# Patient Record
Sex: Male | Born: 1990 | Race: Black or African American | Hispanic: No | Marital: Single | Smoking: Never smoker
Health system: Southern US, Community
[De-identification: ages and names within clinical notes are randomized; demographics above are authoritative.]

## PROBLEM LIST (undated history)

## (undated) DIAGNOSIS — E119 Type 2 diabetes mellitus without complications: Secondary | ICD-10-CM

## (undated) DIAGNOSIS — I10 Essential (primary) hypertension: Secondary | ICD-10-CM

## (undated) DIAGNOSIS — G4730 Sleep apnea, unspecified: Secondary | ICD-10-CM

## (undated) DIAGNOSIS — M659 Synovitis and tenosynovitis, unspecified: Secondary | ICD-10-CM

## (undated) HISTORY — PX: OTHER SURGICAL HISTORY: SHX169

## (undated) HISTORY — DX: Synovitis and tenosynovitis, unspecified: M65.9

## (undated) HISTORY — DX: Sleep apnea, unspecified: G47.30

---

## 2009-12-16 ENCOUNTER — Ambulatory Visit: Payer: Self-pay | Admitting: Otolaryngology

## 2009-12-28 ENCOUNTER — Ambulatory Visit: Payer: Self-pay | Admitting: Otolaryngology

## 2010-04-08 ENCOUNTER — Emergency Department: Payer: Self-pay | Admitting: Emergency Medicine

## 2012-10-10 ENCOUNTER — Ambulatory Visit: Payer: Self-pay | Admitting: Internal Medicine

## 2013-10-22 ENCOUNTER — Ambulatory Visit: Payer: Self-pay | Admitting: Internal Medicine

## 2013-10-22 ENCOUNTER — Emergency Department: Payer: Self-pay | Admitting: Emergency Medicine

## 2013-10-22 LAB — URINALYSIS, COMPLETE
BILIRUBIN, UR: NEGATIVE
Bacteria: NONE SEEN
Hyaline Cast: 75
LEUKOCYTE ESTERASE: NEGATIVE
NITRITE: NEGATIVE
PH: 5 (ref 4.5–8.0)
Protein: 100
SPECIFIC GRAVITY: 1.031 (ref 1.003–1.030)
Squamous Epithelial: <1
WBC UR: 1 /HPF (ref 0–5)

## 2013-10-22 LAB — COMPREHENSIVE METABOLIC PANEL
ALBUMIN: 3.9 g/dL (ref 3.4–5.0)
ANION GAP: 12 (ref 7–16)
Alkaline Phosphatase: 143 U/L — ABNORMAL HIGH
BUN: 13 mg/dL (ref 7–18)
Bilirubin,Total: 0.8 mg/dL (ref 0.2–1.0)
Calcium, Total: 10 mg/dL (ref 8.5–10.1)
Chloride: 93 mmol/L — ABNORMAL LOW (ref 98–107)
Co2: 24 mmol/L (ref 21–32)
Creatinine: 1.45 mg/dL — ABNORMAL HIGH (ref 0.60–1.30)
EGFR (African American): >60
EGFR (Non-African Amer.): >60
GLUCOSE: 602 mg/dL — AB (ref 65–99)
Osmolality: 287 (ref 275–301)
Potassium: 4.2 mmol/L (ref 3.5–5.1)
SGOT(AST): 109 U/L — ABNORMAL HIGH (ref 15–37)
SGPT (ALT): 117 U/L — ABNORMAL HIGH (ref 12–78)
Sodium: 129 mmol/L — ABNORMAL LOW (ref 136–145)
TOTAL PROTEIN: 8.9 g/dL — AB (ref 6.4–8.2)

## 2013-10-22 LAB — CBC
HCT: 48.5 % (ref 40.0–52.0)
HGB: 16.1 g/dL (ref 13.0–18.0)
MCH: 27.2 pg (ref 26.0–34.0)
MCHC: 33.1 g/dL (ref 32.0–36.0)
MCV: 82 fL (ref 80–100)
Platelet: 280 10*3/uL (ref 150–440)
RBC: 5.91 10*6/uL — ABNORMAL HIGH (ref 4.40–5.90)
RDW: 14.5 % (ref 11.5–14.5)
WBC: 10.1 10*3/uL (ref 3.8–10.6)

## 2013-10-22 LAB — HEMOGLOBIN A1C: HEMOGLOBIN A1C: 14 % — AB (ref 4.2–6.3)

## 2015-08-23 ENCOUNTER — Other Ambulatory Visit: Payer: Self-pay | Admitting: Internal Medicine

## 2015-08-23 DIAGNOSIS — K7290 Hepatic failure, unspecified without coma: Secondary | ICD-10-CM

## 2015-08-29 ENCOUNTER — Ambulatory Visit: Payer: Managed Care, Other (non HMO)

## 2017-08-12 ENCOUNTER — Ambulatory Visit (INDEPENDENT_AMBULATORY_CARE_PROVIDER_SITE_OTHER): Payer: Managed Care, Other (non HMO)

## 2017-08-12 ENCOUNTER — Encounter: Payer: Self-pay | Admitting: Emergency Medicine

## 2017-08-12 ENCOUNTER — Ambulatory Visit
Admission: EM | Admit: 2017-08-12 | Discharge: 2017-08-12 | Disposition: A | Discharge status: Home / Self Care | Payer: Managed Care, Other (non HMO) | Attending: Family Medicine | Admitting: Family Medicine

## 2017-08-12 ENCOUNTER — Ambulatory Visit: Payer: Managed Care, Other (non HMO)

## 2017-08-12 DIAGNOSIS — R2242 Localized swelling, mass and lump, left lower limb: Secondary | ICD-10-CM | POA: Diagnosis not present

## 2017-08-12 DIAGNOSIS — M25572 Pain in left ankle and joints of left foot: Secondary | ICD-10-CM

## 2017-08-12 HISTORY — DX: Essential (primary) hypertension: I10

## 2017-08-12 HISTORY — DX: Type 2 diabetes mellitus without complications: E11.9

## 2017-08-12 MED ORDER — MELOXICAM 15 MG PO TABS: 15.0000 mg | ORAL_TABLET | Freq: Every day | ORAL | 0 refills | Status: DC

## 2017-08-12 NOTE — ED Triage Notes (Signed)
Patient in today with his mother c/o left ankle pain and swelling x 3 days. Patient has had an old injury with hairline fracture in the past ~4-5 years ago. No injury noted for this episode of pain and swelling.

## 2017-08-12 NOTE — Discharge Instructions (Signed)
Medication as needed.  Try the exercises.   Take care  Dr. Adriana Simasook

## 2017-08-12 NOTE — ED Provider Notes (Signed)
[facility]    CSN: [id number] Arrival date & time: 08/12/17  1037     History   Chief Complaint Chief Complaint  Patient presents with  . Ankle Pain    swelling   HPI  26 year old male presents with the above complaint.  Started on Friday.  He reports left ankle/foot pain and associated swelling.  He is not sure of any injury.  He is on his feet a lot.  No medications or interventions tried.  No known exacerbating or relieving factors.  Mild to moderate in severity.  His primary concern is swelling.  He reports a prior old injury.  No new medications.  No other associated symptoms.  No other complaints at this time.  Past Medical History:  Diagnosis Date  . Diabetes mellitus without complication (HCC)   . Hypertension   Proteinuria Morbid obesity  History reviewed. No pertinent surgical history.   Home Medications    Prior to Admission medications   Medication Sig Start Date End Date Taking? Authorizing Provider  hydrochlorothiazide (HYDRODIURIL) 25 MG tablet Take 25 mg by mouth daily.   Yes [provider]  lisinopril (PRINIVIL,ZESTRIL) 20 MG tablet Take 20 mg by mouth daily.   Yes [provider]  omeprazole (PRILOSEC) 20 MG capsule Take 20 mg by mouth daily as needed.   Yes [provider]  sitaGLIPtin-metformin (JANUMET) 50-1000 MG tablet Take 1 tablet by mouth 2 (two) times daily with a meal.   Yes [provider]  meloxicam (MOBIC) 15 MG tablet Take 1 tablet (15 mg total) by mouth daily. 08/12/17   Tommie Samsook, Larrie Fraizer G, DO   Family History Kidney failure Maternal Aunt  on dialysis - cause unknown  No Known Problems Mother     Social History Social History  Substance Use Topics  . Smoking status: Never Smoker  . Smokeless tobacco: Never Used  . Alcohol use No     Allergies   Patient has no known allergies.   Review of Systems Review of Systems  Musculoskeletal:       Left ankle and left heel pain. Left  ankle swelling.  All other systems reviewed and are negative.  Physical Exam Triage Vital Signs ED Triage Vitals  Enc Vitals Group     BP 08/12/17 1053 121/74     Pulse Rate 08/12/17 1053 100     Resp 08/12/17 1053 16     Temp 08/12/17 1053 98.7 F (37.1 C)     Temp Source 08/12/17 1053 Oral     SpO2 08/12/17 1053 100 %     Weight 08/12/17 1052 (!) 372 lb (168.7 kg)     Height 08/12/17 1052 5\' 7"  (1.702 m)     Head Circumference --      Peak Flow --      Pain Score 08/12/17 1052 6     Pain Loc --      Pain Edu? --      Excl. in GC? --    Updated Vital Signs BP 121/74 (BP Location: Left Arm)   Pulse 100   Temp 98.7 F (37.1 C) (Oral)   Resp 16   Ht 5\' 7"  (1.702 m)   Wt (!) 372 lb (168.7 kg)   SpO2 100%   BMI 58.26 kg/m   Physical Exam  Constitutional: He is oriented to person, place, and time. He appears well-developed. No distress.  Morbidly obese.  HENT:  Head: Normocephalic and atraumatic.  Nose: Nose normal.  Eyes:  Conjunctivae are normal. No scleral icterus.  Neck: Normal range of motion. No tracheal deviation present.  Cardiovascular: Normal rate and regular rhythm.   No murmur heard. Pulmonary/Chest: Effort normal and breath sounds normal. He has no wheezes. He has no rales.  Musculoskeletal:  Left ankle -no pain with range of motion.  Normal range of motion, although this appears to be limited by morbid obesity.  No discrete areas of tenderness appreciated.  Swelling is noted particularly at the lateral malleolus.  Patient does have some vague heel pain but it is hard to localize. Mild warmth.  Neurological: He is alert and oriented to person, place, and time.  No apparent focal deficits.  Normal tone.  Skin: Skin is warm. No rash noted.  Psychiatric: He has a normal mood and affect. His behavior is normal. Thought content normal.  Vitals reviewed.  UC Treatments / Results  Labs (all labs ordered are listed, but only abnormal results are displayed) Labs  Reviewed - No data to display  EKG  EKG Interpretation None       Radiology Dg Ankle Complete Left  Result Date: 08/12/2017 CLINICAL DATA:  Left ankle pain and swelling. EXAM: LEFT ANKLE COMPLETE - 3+ VIEW COMPARISON:  None. FINDINGS: There is no evidence of fracture, dislocation, or joint effusion. Mild soft tissue prominence overlying the lateral malleolus. Mild degenerative disease of the ankle mortise affecting primarily the medial ankle. No bony lesions. IMPRESSION: Mild soft tissue prominence without evidence of acute injury. Mild degenerative disease of the ankle mortise primarily affecting the medial ankle. Electronically Signed   By: Irish Lack M.D.   On: 08/12/2017 12:01   Dg Foot Complete Left  Result Date: 08/12/2017 CLINICAL DATA:  Swelling of left heel with pain. EXAM: LEFT FOOT - COMPLETE 3+ VIEW COMPARISON:  None. FINDINGS: There is no evidence of fracture or dislocation. There is no evidence of arthropathy or other focal bone abnormality. Mild soft tissue prominence along the plantar aspect of the foot. No soft tissue foreign body identified. IMPRESSION: Mild plantar soft tissue prominence. Electronically Signed   By: Irish Lack M.D.   On: 08/12/2017 12:03    Procedures Procedures (including critical care time)  Medications Ordered in UC Medications - No data to display   Initial Impression / Assessment and Plan / UC Course  I have reviewed the triage vital signs and the nursing notes.  Pertinent labs & imaging results that were available during my care of the patient were reviewed by me and considered in my medical decision making (see chart for details).    26 year old male presents with ankle/foot pain.  X-ray with mild degenerative changes but no acute findings.  Uncertain etiology.  He may have a component of plantar fasciitis.  Treating with meloxicam.  I did give him some exercises as well.  Final Clinical Impressions(s) / UC Diagnoses   Final  diagnoses:  Acute left ankle pain   New Prescriptions New Prescriptions   MELOXICAM (MOBIC) 15 MG TABLET    Take 1 tablet (15 mg total) by mouth daily.   Controlled Substance Prescriptions [place] Controlled Substance Registry consulted? Not Applicable   Tommie Sams, DO 08/12/17 1213

## 2017-09-18 ENCOUNTER — Ambulatory Visit: Payer: Self-pay | Admitting: Internal Medicine

## 2017-09-20 ENCOUNTER — Ambulatory Visit: Payer: Managed Care, Other (non HMO) | Admitting: Internal Medicine

## 2017-09-20 ENCOUNTER — Encounter: Payer: Self-pay | Admitting: Internal Medicine

## 2017-09-20 VITALS — BP 128/83 | HR 84 | Resp 16 | Ht 66.0 in | Wt 367.0 lb

## 2017-09-20 DIAGNOSIS — M659 Synovitis and tenosynovitis, unspecified: Secondary | ICD-10-CM

## 2017-09-20 DIAGNOSIS — E118 Type 2 diabetes mellitus with unspecified complications: Secondary | ICD-10-CM | POA: Insufficient documentation

## 2017-09-20 DIAGNOSIS — Z6841 Body Mass Index (BMI) 40.0 and over, adult: Secondary | ICD-10-CM | POA: Diagnosis not present

## 2017-09-20 DIAGNOSIS — M65979 Unspecified synovitis and tenosynovitis, unspecified ankle and foot: Secondary | ICD-10-CM | POA: Insufficient documentation

## 2017-09-20 DIAGNOSIS — I129 Hypertensive chronic kidney disease with stage 1 through stage 4 chronic kidney disease, or unspecified chronic kidney disease: Secondary | ICD-10-CM

## 2017-09-20 DIAGNOSIS — Z23 Encounter for immunization: Secondary | ICD-10-CM | POA: Diagnosis not present

## 2017-09-20 DIAGNOSIS — N181 Chronic kidney disease, stage 1: Secondary | ICD-10-CM

## 2017-09-20 DIAGNOSIS — Z114 Encounter for screening for human immunodeficiency virus [HIV]: Secondary | ICD-10-CM | POA: Diagnosis not present

## 2017-09-20 DIAGNOSIS — E1122 Type 2 diabetes mellitus with diabetic chronic kidney disease: Secondary | ICD-10-CM

## 2017-09-20 DIAGNOSIS — K219 Gastro-esophageal reflux disease without esophagitis: Secondary | ICD-10-CM | POA: Diagnosis not present

## 2017-09-20 DIAGNOSIS — I10 Essential (primary) hypertension: Secondary | ICD-10-CM | POA: Insufficient documentation

## 2017-09-20 HISTORY — DX: Unspecified synovitis and tenosynovitis, unspecified ankle and foot: M65.979

## 2017-09-20 HISTORY — DX: Synovitis and tenosynovitis, unspecified: M65.9

## 2017-09-20 NOTE — Progress Notes (Signed)
Date:  09/20/2017   Name:  Elijah Wells   DOB:  12/09/1990   MRN:  [id number]   Chief Complaint: Establish Care; Diabetes; Hypertension; and Gastroesophageal Reflux New patient transferring care from Dr. Nada [place]Jidali in [place].  His old MD does not accept his new insurance.  Diabetes  He presents for his follow-up diabetic visit. He has type 2 diabetes mellitus. His disease course has been stable. Pertinent negatives for hypoglycemia include no headaches or tremors. Pertinent negatives for diabetes include no chest pain, no fatigue, no foot paresthesias, no foot ulcerations, no polydipsia, no polyuria, no visual change and no weight loss. Symptoms are stable. Diabetic complications include nephropathy [facility] Current diabetic treatment includes oral agent (dual therapy) (janumet). He is compliant with treatment most of the time. His weight is stable. He participates in exercise every other day. There is no compliance with monitoring of blood glucose. Home blood sugar record trend: does not check BS. An ACE inhibitor/angiotensin II receptor blocker is being taken.  Hypertension  This is a chronic problem. The problem is unchanged. The problem is controlled. Pertinent negatives include no chest pain, headaches, palpitations or shortness of breath. Past treatments include diuretics and ACE inhibitors. The current treatment provides significant improvement.  Gastroesophageal Reflux  He complains of heartburn. He reports no abdominal pain, no chest pain, no coughing or no wheezing. This is a recurrent problem. The problem occurs rarely. Pertinent negatives include no fatigue or weight loss. There are no known risk factors. He has tried a PPI for the symptoms. The treatment provided significant relief.     Review of Systems  Constitutional: Negative for appetite change, fatigue, unexpected weight change and weight loss.  Eyes: Negative for visual disturbance.  Respiratory:  Negative for cough, shortness of breath and wheezing.   Cardiovascular: Negative for chest pain, palpitations and leg swelling.  Gastrointestinal: Positive for heartburn. Negative for abdominal pain and blood in stool.  Endocrine: Negative for polydipsia and polyuria.  Genitourinary: Negative for dysuria and hematuria.  Musculoskeletal: Positive for arthralgias (intermittent ankle pains).  Skin: Negative for color change and rash.  Neurological: Negative for tremors, numbness and headaches.  Psychiatric/Behavioral: Negative for dysphoric mood and sleep disturbance.    There are no active problems to display for this patient.   Prior to Admission medications   Medication Sig Start Date End Date Taking? Authorizing Provider  hydrochlorothiazide (HYDRODIURIL) 25 MG tablet Take 25 mg by mouth daily.   Yes [provider]  lisinopril (PRINIVIL,ZESTRIL) 20 MG tablet Take 20 mg by mouth daily.   Yes [provider]  omeprazole (PRILOSEC) 20 MG capsule Take 20 mg by mouth daily as needed.   Yes [provider]  sitaGLIPtin-metformin (JANUMET) 50-1000 MG tablet Take 1 tablet by mouth 2 (two) times daily with a meal.   Yes [provider]    No Known Allergies  History reviewed. No pertinent surgical history.  Social History   Tobacco Use  . Smoking status: Never Smoker  . Smokeless tobacco: Never Used  Substance Use Topics  . Alcohol use: No  . Drug use: No     Medication list has been reviewed and updated.  PHQ 2/9 Scores 09/20/2017  PHQ - 2 Score 0    Physical Exam  Constitutional: He is oriented to person, place, and time. He appears well-developed. No distress.  HENT:  Head: Normocephalic and atraumatic.  Neck: Normal range of motion. Neck supple. No thyromegaly present.  Cardiovascular: Normal rate, regular rhythm and normal heart sounds.  Pulmonary/Chest: Effort normal and breath sounds normal. No respiratory distress. He has no  wheezes. He has no rales.  Musculoskeletal: Normal range of motion. He exhibits no edema or tenderness.  Neurological: He is alert and oriented to person, place, and time.  Skin: Skin is warm and dry. No rash noted.  Psychiatric: He has a normal mood and affect. His behavior is normal. Thought content normal.  Nursing note and vitals reviewed.   BP 128/83   Pulse 84   Resp 16   Ht 5\' 6"  (1.676 m)   Wt (!) 367 lb (166.5 kg)   SpO2 98%   BMI 59.24 kg/m   Assessment and Plan: 1. 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 Essential hypertension On ACE  3. Gastroesophageal reflux disease, esophagitis presence not specified Continue PPI  4. Need for influenza vaccination - Flu Vaccine QUAD 6+ mos PF IM (Fluarix Quad PF)  5. Synovitis of ankle Continue nsaids PRN  6. Encounter for screening for HIV - HIV antibody   No orders of the defined types were placed in this encounter.   Partially dictated using Animal nutritionistDragon software. Any errors are unintentional.  Bari EdwardLaura Edrik Rundle, MD [facility]  09/20/2017

## 2017-09-21 LAB — COMPREHENSIVE METABOLIC PANEL
ALBUMIN: 4.6 g/dL (ref 3.5–5.5)
ALT: 43 IU/L (ref 0–44)
AST: 36 IU/L (ref 0–40)
Albumin/Globulin Ratio: 1.4 (ref 1.2–2.2)
Alkaline Phosphatase: 66 IU/L (ref 39–117)
BUN / CREAT RATIO: 13 (ref 9–20)
BUN: 14 mg/dL (ref 6–20)
Bilirubin Total: 0.5 mg/dL (ref 0.0–1.2)
CALCIUM: 10.4 mg/dL — AB (ref 8.7–10.2)
CO2: 26 mmol/L (ref 20–29)
CREATININE: 1.07 mg/dL (ref 0.76–1.27)
Chloride: 100 mmol/L (ref 96–106)
GFR, EST AFRICAN AMERICAN: 109 mL/min/{1.73_m2} (ref >59)
GFR, EST NON AFRICAN AMERICAN: 95 mL/min/{1.73_m2} (ref >59)
GLOBULIN, TOTAL: 3.3 g/dL (ref 1.5–4.5)
GLUCOSE: 94 mg/dL (ref 65–99)
Potassium: 4.3 mmol/L (ref 3.5–5.2)
SODIUM: 141 mmol/L (ref 134–144)
TOTAL PROTEIN: 7.9 g/dL (ref 6.0–8.5)

## 2017-09-21 LAB — HIV ANTIBODY (ROUTINE TESTING W REFLEX): HIV SCREEN 4TH GENERATION: NONREACTIVE

## 2017-09-21 LAB — MICROALBUMIN / CREATININE URINE RATIO
Creatinine, Urine: 250.1 mg/dL
MICROALB/CREAT RATIO: 48.9 mg/g{creat} — AB (ref 0.0–30.0)
Microalbumin, Urine: 122.2 ug/mL

## 2017-09-21 LAB — HEMOGLOBIN A1C
Est. average glucose Bld gHb Est-mCnc: 137 mg/dL
Hgb A1c MFr Bld: 6.4 % — ABNORMAL HIGH (ref 4.8–5.6)

## 2017-10-09 ENCOUNTER — Encounter: Payer: Self-pay | Admitting: Internal Medicine

## 2017-10-09 ENCOUNTER — Other Ambulatory Visit: Payer: Self-pay

## 2017-10-09 MED ORDER — HYDROCHLOROTHIAZIDE 25 MG PO TABS: 25.0000 mg | ORAL_TABLET | Freq: Every day | ORAL | 1 refills | Status: DC

## 2017-10-09 MED ORDER — LISINOPRIL 20 MG PO TABS: 20.0000 mg | ORAL_TABLET | Freq: Every day | ORAL | 1 refills | Status: DC

## 2017-11-08 ENCOUNTER — Telehealth: Payer: Self-pay

## 2017-11-08 NOTE — Telephone Encounter (Signed)
It is up to him if he needs to be seen in UC.  Otherwise, he could take advil or aleve over the weekend for symptoms.

## 2017-11-08 NOTE — Telephone Encounter (Signed)
Patient calling stating he is having Left Ankle Swelling. Its tender to touch, and feels like a lot of pressure around the ankle. Wanted to know doctors advice whether he should see UC or not? Has had this problem in the past where he seen Urgent Care for.   Please Advise.

## 2017-11-08 NOTE — Telephone Encounter (Signed)
Patient informed on VM

## 2017-12-15 ENCOUNTER — Encounter: Payer: Self-pay | Admitting: Internal Medicine

## 2017-12-21 ENCOUNTER — Ambulatory Visit
Admission: EM | Admit: 2017-12-21 | Discharge: 2017-12-21 | Disposition: A | Discharge status: Home / Self Care | Payer: Managed Care, Other (non HMO) | Attending: Family Medicine | Admitting: Family Medicine

## 2017-12-21 ENCOUNTER — Other Ambulatory Visit: Payer: Self-pay

## 2017-12-21 DIAGNOSIS — R05 Cough: Secondary | ICD-10-CM

## 2017-12-21 DIAGNOSIS — R059 Cough, unspecified: Secondary | ICD-10-CM

## 2017-12-21 HISTORY — DX: Morbid (severe) obesity due to excess calories: E66.01

## 2017-12-21 MED ORDER — HYDROCOD POLST-CPM POLST ER 10-8 MG/5ML PO SUER: 5.0000 mL | Freq: Two times a day (BID) | ORAL | 0 refills | Status: DC | PRN

## 2017-12-21 NOTE — ED Provider Notes (Signed)
[facility]    CSN: [id number] Arrival date & time: 12/21/17  1504  History   Chief Complaint Chief Complaint  Patient presents with  . Cough   HPI  27 year old male presents with cough.  Patient reports a 2-week history of cough following a recent upper respiratory infection.  Slightly productive.  Worse at night.  He has been taking Robitussin and Delsym without improvement.  No fevers or chills.  He feels otherwise well.  No other associated symptoms.  No other complaints at this time.  Past Medical History:  Diagnosis Date  . Diabetes mellitus without complication (HCC)   . Hypertension   . Morbid obesity [facility])     Patient Active Problem List   Diagnosis Date Noted  . Encounter for screening for HIV 09/20/2017  . 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 Essential hypertension 09/20/2017  . Gastroesophageal reflux disease 09/20/2017  . Synovitis of ankle 09/20/2017  . BMI 50.0-59.9, adult (HCC) 09/20/2017    Past Surgical History:  Procedure Laterality Date  . none      Home Medications    Prior to Admission medications   Medication Sig Start Date End Date Taking? Authorizing Provider  chlorpheniramine-HYDROcodone (TUSSIONEX PENNKINETIC ER) 10-8 MG/5ML SUER Take 5 mLs by mouth every 12 (twelve) hours as needed. 12/21/17   Tommie Sams, DO  hydrochlorothiazide (HYDRODIURIL) 25 MG tablet Take 1 tablet (25 mg total) by mouth daily. 10/09/17   Reubin Milan, MD  lisinopril (PRINIVIL,ZESTRIL) 20 MG tablet Take 1 tablet (20 mg total) by mouth daily. 10/09/17   Reubin Milan, MD  omeprazole (PRILOSEC) 20 MG capsule Take 20 mg by mouth daily as needed.    [provider]  sitaGLIPtin-metformin (JANUMET) 50-1000 MG tablet Take 1 tablet by mouth 2 (two) times daily with a meal.    [provider]    Family History Family History  Problem Relation Age of Onset  . Breast cancer Maternal Grandmother     Social  History Social History   Tobacco Use  . Smoking status: Never Smoker  . Smokeless tobacco: Never Used  Substance Use Topics  . Alcohol use: No  . Drug use: No     Allergies   Patient has no known allergies.   Review of Systems Review of Systems  Constitutional: Negative for fever.  Respiratory: Positive for cough.    Physical Exam Triage Vital Signs ED Triage Vitals  Enc Vitals Group     BP 12/21/17 1513 (!) 137/103     Pulse Rate 12/21/17 1513 92     Resp 12/21/17 1513 20     Temp 12/21/17 1513 98.3 F (36.8 C)     Temp Source 12/21/17 1513 Oral     SpO2 12/21/17 1513 100 %     Weight 12/21/17 1515 (!) 364 lb (165.1 kg)     Height 12/21/17 1515 5\' 6"  (1.676 m)     Head Circumference --      Peak Flow --      Pain Score 12/21/17 1621 0     Pain Loc --      Pain Edu? --      Excl. in GC? --    Updated Vital Signs BP (!) 137/103 (BP Location: Right Arm)   Pulse 92   Temp 98.3 F (36.8 C) (Oral)   Resp 20   Ht 5\' 6"  (1.676 m)   Wt (!) 364 lb (165.1 kg)  SpO2 100%   BMI 58.75 kg/m     Physical Exam  Constitutional: He is oriented to person, place, and time. He appears well-developed. No distress.  HENT:  Head: Normocephalic and atraumatic.  Mouth/Throat: Oropharynx is clear and moist.  Cardiovascular: Normal rate and regular rhythm.  Pulmonary/Chest: Effort normal and breath sounds normal.  Neurological: He is alert and oriented to person, place, and time.  Psychiatric: He has a normal mood and affect. His behavior is normal.  Nursing note and vitals reviewed.  UC Treatments / Results  Labs (all labs ordered are listed, but only abnormal results are displayed) Labs Reviewed - No data to display  EKG  EKG Interpretation None       Radiology No results found.  Procedures Procedures (including critical care time)  Medications Ordered in UC Medications - No data to display   Initial Impression / Assessment and Plan / UC Course  I have  reviewed the triage vital signs and the nursing notes.  Pertinent labs & imaging results that were available during my care of the patient were reviewed by me and considered in my medical decision making (see chart for details).     27 year old male presents with a cough following a recent upper respiratory infection.  This is a postinfectious cough.  No indication for antibiotics.  Tussionex for cough.  Final Clinical Impressions(s) / UC Diagnoses   Final diagnoses:  Cough    ED Discharge Orders        Ordered    chlorpheniramine-HYDROcodone (TUSSIONEX PENNKINETIC ER) 10-8 MG/5ML SUER  Every 12 hours PRN     12/21/17 1616     Controlled Substance Prescriptions [place] Controlled Substance Registry consulted? Not Applicable   Tommie SamsCook, Ohana Birdwell G, DO 12/21/17 1725

## 2017-12-21 NOTE — ED Triage Notes (Signed)
Pt with cough x 2 weeks. Productive of clear mucous.

## 2018-01-20 ENCOUNTER — Ambulatory Visit (INDEPENDENT_AMBULATORY_CARE_PROVIDER_SITE_OTHER): Payer: Managed Care, Other (non HMO) | Admitting: Internal Medicine

## 2018-01-20 ENCOUNTER — Ambulatory Visit: Payer: Managed Care, Other (non HMO) | Admitting: Internal Medicine

## 2018-01-20 ENCOUNTER — Encounter: Payer: Self-pay | Admitting: Internal Medicine

## 2018-01-20 VITALS — BP 120/82 | HR 94 | Ht 66.0 in | Wt 364.0 lb

## 2018-01-20 DIAGNOSIS — K219 Gastro-esophageal reflux disease without esophagitis: Secondary | ICD-10-CM

## 2018-01-20 DIAGNOSIS — E1122 Type 2 diabetes mellitus with diabetic chronic kidney disease: Secondary | ICD-10-CM | POA: Diagnosis not present

## 2018-01-20 DIAGNOSIS — I129 Hypertensive chronic kidney disease with stage 1 through stage 4 chronic kidney disease, or unspecified chronic kidney disease: Secondary | ICD-10-CM

## 2018-01-20 DIAGNOSIS — N181 Chronic kidney disease, stage 1: Secondary | ICD-10-CM | POA: Diagnosis not present

## 2018-01-20 DIAGNOSIS — Z6841 Body Mass Index (BMI) 40.0 and over, adult: Secondary | ICD-10-CM | POA: Diagnosis not present

## 2018-01-20 NOTE — Progress Notes (Signed)
Date:  01/20/2018   Name:  Elijah Wells   DOB:  [birth year]   MRN:  [id number]   Chief Complaint: Diabetes Diabetes  He presents for his follow-up diabetic visit. He has type 2 diabetes mellitus. His disease course has been stable. Pertinent negatives for hypoglycemia include no headaches or tremors. Pertinent negatives for diabetes include no chest pain, no fatigue, no foot ulcerations, no polydipsia, no polyuria and no weight loss. Symptoms are stable. Current diabetic treatment includes oral agent (dual therapy). He is compliant with treatment most of the time. An ACE inhibitor/angiotensin II receptor blocker is being taken. Eye exam is not current.  Hypertension  This is a chronic problem. The problem is controlled. Pertinent negatives include no chest pain, headaches, palpitations or shortness of breath. Past treatments include ACE inhibitors and diuretics. The current treatment provides significant improvement.  Gastroesophageal Reflux  He reports no abdominal pain, no chest pain, no coughing or no wheezing. This is a chronic problem. The problem occurs occasionally. Pertinent negatives include no fatigue or weight loss. He has tried a PPI for the symptoms.    Lab Results  Component Value Date   HGBA1C 6.4 (H) 09/20/2017   Lab Results  Component Value Date   CREATININE 1.07 09/20/2017   BUN 14 09/20/2017   NA 141 09/20/2017   K 4.3 09/20/2017   CL 100 09/20/2017   CO2 26 09/20/2017     Review of Systems  Constitutional: Negative for appetite change, fatigue, unexpected weight change and weight loss.  Eyes: Negative for visual disturbance.  Respiratory: Negative for cough, shortness of breath and wheezing.   Cardiovascular: Negative for chest pain, palpitations and leg swelling.  Gastrointestinal: Negative for abdominal pain and blood in stool.  Endocrine: Negative for polydipsia and polyuria.  Genitourinary: Negative for dysuria and hematuria.  Skin: Negative for color  change and rash.  Neurological: Negative for tremors, numbness and headaches.  Psychiatric/Behavioral: Negative for dysphoric mood.    Patient Active Problem List   Diagnosis Date Noted  . Encounter for screening for HIV 09/20/2017  . 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 Essential hypertension 09/20/2017  . Gastroesophageal reflux disease 09/20/2017  . Synovitis of ankle 09/20/2017  . BMI 50.0-59.9, adult (HCC) 09/20/2017    Prior to Admission medications   Medication Sig Start Date End Date Taking? Authorizing Provider  hydrochlorothiazide (HYDRODIURIL) 25 MG tablet Take 1 tablet (25 mg total) by mouth daily. 10/09/17  Yes Reubin MilanBerglund, Gregory Dowe H, MD  lisinopril (PRINIVIL,ZESTRIL) 20 MG tablet Take 1 tablet (20 mg total) by mouth daily. 10/09/17  Yes Reubin MilanBerglund, Hermon Zea H, MD  omeprazole (PRILOSEC) 20 MG capsule Take 20 mg by mouth daily as needed.   Yes [provider]  sitaGLIPtin-metformin (JANUMET) 50-1000 MG tablet Take 1 tablet by mouth 2 (two) times daily with a meal.   Yes [provider]    No Known Allergies  Past Surgical History:  Procedure Laterality Date  . none      Social History   Tobacco Use  . Smoking status: Never Smoker  . Smokeless tobacco: Never Used  Substance Use Topics  . Alcohol use: No  . Drug use: No     Medication list has been reviewed and updated.  PHQ 2/9 Scores 09/20/2017  PHQ - 2 Score 0    Physical Exam  Constitutional: He is oriented to person, place, and time. He appears well-developed. No distress.  HENT:  Head: Normocephalic and atraumatic.  Neck: Carotid bruit is not present.  Cardiovascular: Normal rate, regular rhythm and normal heart sounds.  Pulmonary/Chest: Effort normal and breath sounds normal. No respiratory distress. He has no wheezes. He has no rhonchi.  Musculoskeletal: Normal range of motion.  Neurological: He is alert and oriented to person, place, and time. No sensory  deficit.  Skin: Skin is warm and dry. No rash noted.  Psychiatric: He has a normal mood and affect. His speech is normal and behavior is normal. Thought content normal.  Nursing note and vitals reviewed.   BP 120/82   Pulse 94   Ht 5\' 6"  (1.676 m)   Wt (!) 364 lb (165.1 kg)   SpO2 98%   BMI 58.75 kg/m   Assessment and Plan: 1. 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 BMI 50.0-59.9, adult (HCC) Continue to work on diet and weight loss  3. Gastroesophageal reflux disease, esophagitis presence not specified Stable on PPI   No orders of the defined types were placed in this encounter.   Partially dictated using Animal nutritionist. Any errors are unintentional.  Bari Edward, MD [facility]  01/20/2018

## 2018-01-22 ENCOUNTER — Ambulatory Visit: Payer: Self-pay | Admitting: Internal Medicine

## 2018-01-25 ENCOUNTER — Telehealth: Payer: Self-pay | Admitting: Internal Medicine

## 2018-01-25 NOTE — Telephone Encounter (Signed)
Overdue labs

## 2018-01-27 NOTE — Telephone Encounter (Signed)
LVMTCB about why haven't had labs done yet.

## 2018-05-27 ENCOUNTER — Other Ambulatory Visit
Admission: RE | Admit: 2018-05-27 | Discharge: 2018-05-27 | Disposition: A | Discharge status: Home / Self Care | Payer: Managed Care, Other (non HMO) | Source: Ambulatory Visit | Attending: Internal Medicine | Admitting: Internal Medicine

## 2018-05-27 ENCOUNTER — Ambulatory Visit (INDEPENDENT_AMBULATORY_CARE_PROVIDER_SITE_OTHER): Payer: Managed Care, Other (non HMO) | Admitting: Internal Medicine

## 2018-05-27 ENCOUNTER — Encounter: Payer: Self-pay | Admitting: Internal Medicine

## 2018-05-27 VITALS — BP 108/76 | HR 66 | Ht 66.0 in | Wt 373.0 lb

## 2018-05-27 DIAGNOSIS — I10 Essential (primary) hypertension: Secondary | ICD-10-CM

## 2018-05-27 DIAGNOSIS — I129 Hypertensive chronic kidney disease with stage 1 through stage 4 chronic kidney disease, or unspecified chronic kidney disease: Secondary | ICD-10-CM

## 2018-05-27 DIAGNOSIS — N181 Chronic kidney disease, stage 1: Secondary | ICD-10-CM | POA: Insufficient documentation

## 2018-05-27 DIAGNOSIS — E1122 Type 2 diabetes mellitus with diabetic chronic kidney disease: Secondary | ICD-10-CM

## 2018-05-27 LAB — HEMOGLOBIN A1C
Hgb A1c MFr Bld: 6.5 % — ABNORMAL HIGH (ref 4.8–5.6)
MEAN PLASMA GLUCOSE: 139.85 mg/dL

## 2018-05-27 LAB — LIPID PANEL
Cholesterol: 114 mg/dL (ref 0–200)
HDL: 38 mg/dL — ABNORMAL LOW (ref >40)
LDL CALC: 63 mg/dL (ref 0–99)
TRIGLYCERIDES: 66 mg/dL (ref <150)
Total CHOL/HDL Ratio: 3 RATIO
VLDL: 13 mg/dL (ref 0–40)

## 2018-05-27 LAB — TSH: TSH: 1.744 u[IU]/mL (ref 0.350–4.500)

## 2018-05-27 NOTE — Patient Instructions (Signed)
Schedule Diabetic Eye exam - ask them to send me a result note

## 2018-05-27 NOTE — Progress Notes (Signed)
Date:  05/27/2018   Name:  Elijah Wells   DOB:  [birth year]   MRN:  [id number]   Chief Complaint: Diabetes (BS- 114 X2 weeks ago. ) and Hypertension Diabetes  He presents for his follow-up diabetic visit. He has type 2 diabetes mellitus. His disease course has been stable. Pertinent negatives for hypoglycemia include no headaches or tremors. Pertinent negatives for diabetes include no chest pain, no fatigue, no polydipsia and no polyuria. Current diabetic treatment includes oral agent (dual therapy). He is compliant with treatment all of the time. He monitors blood glucose at home 1-2 x per week. There is no change in his home blood glucose trend. His breakfast blood glucose range is generally 110-130 mg/dl. An ACE inhibitor/angiotensin II receptor blocker is being taken.  Hypertension  This is a chronic problem. The problem is controlled. Pertinent negatives include no chest pain, headaches, palpitations or shortness of breath. Past treatments include angiotensin blockers. The current treatment provides significant improvement. There are no compliance problems.    Lab Results  Component Value Date   HGBA1C 6.4 (H) 09/20/2017   Lab Results  Component Value Date   CREATININE 1.07 09/20/2017   BUN 14 09/20/2017   NA 141 09/20/2017   K 4.3 09/20/2017   CL 100 09/20/2017   CO2 26 09/20/2017      Review of Systems  Constitutional: Negative for appetite change, fatigue and unexpected weight change.  Eyes: Negative for visual disturbance.  Respiratory: Negative for cough, shortness of breath and wheezing.   Cardiovascular: Negative for chest pain, palpitations and leg swelling.  Gastrointestinal: Negative for abdominal pain and blood in stool.  Endocrine: Negative for polydipsia and polyuria.  Genitourinary: Negative for dysuria and hematuria.  Skin: Negative for color change and rash.  Neurological: Negative for tremors, numbness and headaches.  Psychiatric/Behavioral: Negative  for dysphoric mood.    Patient Active Problem List   Diagnosis Date Noted  . 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 Essential hypertension 09/20/2017  . Gastroesophageal reflux disease 09/20/2017  . BMI 50.0-59.9, adult (HCC) 09/20/2017    No Known Allergies  Past Surgical History:  Procedure Laterality Date  . none      Social History   Tobacco Use  . Smoking status: Never Smoker  . Smokeless tobacco: Never Used  Substance Use Topics  . Alcohol use: No  . Drug use: No     Medication list has been reviewed and updated.  Current Meds  Medication Sig  . hydrochlorothiazide (HYDRODIURIL) 25 MG tablet Take 1 tablet (25 mg total) by mouth daily.  Marland Kitchen. lisinopril (PRINIVIL,ZESTRIL) 20 MG tablet Take 1 tablet (20 mg total) by mouth daily.  Marland Kitchen. omeprazole (PRILOSEC) 20 MG capsule Take 20 mg by mouth daily as needed.  . sitaGLIPtin-metformin (JANUMET) 50-1000 MG tablet Take 1 tablet by mouth 2 (two) times daily with a meal.    PHQ 2/9 Scores 09/20/2017  PHQ - 2 Score 0    Physical Exam  Constitutional: He is oriented to person, place, and time. He appears well-developed. No distress.  HENT:  Head: Normocephalic and atraumatic.  Cardiovascular: Normal rate, regular rhythm and normal heart sounds.  Pulmonary/Chest: Effort normal and breath sounds normal. No respiratory distress.  Musculoskeletal: Normal range of motion.  Neurological: He is alert and oriented to person, place, and time.  Skin: Skin is warm and dry. No rash noted.  Psychiatric: He has a normal mood and affect. His  behavior is normal. Thought content normal.  Nursing note and vitals reviewed.   BP 108/76   Pulse 66   Ht 5\' 6"  (1.676 m)   Wt (!) 373 lb (169.2 kg)   SpO2 97%   BMI 60.20 kg/m   Assessment and Plan: 1. Essential hypertension controlled - TSH  2. 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   Partially dictated using Animal nutritionistDragon software. Any errors are unintentional.  Bari EdwardLaura Dusten Ellinwood, MD [facility]  05/27/2018

## 2018-06-28 LAB — HM DIABETES EYE EXAM

## 2018-07-03 ENCOUNTER — Encounter: Payer: Self-pay | Admitting: Internal Medicine

## 2018-08-04 ENCOUNTER — Other Ambulatory Visit: Payer: Self-pay

## 2018-08-04 ENCOUNTER — Encounter: Payer: Self-pay | Admitting: Emergency Medicine

## 2018-08-04 ENCOUNTER — Encounter: Payer: Self-pay | Admitting: Internal Medicine

## 2018-08-04 ENCOUNTER — Ambulatory Visit
Admission: EM | Admit: 2018-08-04 | Discharge: 2018-08-04 | Disposition: A | Discharge status: Home / Self Care | Payer: Managed Care, Other (non HMO) | Attending: Internal Medicine | Admitting: Internal Medicine

## 2018-08-04 DIAGNOSIS — L03319 Cellulitis of trunk, unspecified: Secondary | ICD-10-CM | POA: Diagnosis not present

## 2018-08-04 DIAGNOSIS — L02219 Cutaneous abscess of trunk, unspecified: Secondary | ICD-10-CM | POA: Diagnosis not present

## 2018-08-04 MED ORDER — DOXYCYCLINE HYCLATE 100 MG PO TABS: 100.0000 mg | ORAL_TABLET | Freq: Two times a day (BID) | ORAL | 0 refills | Status: DC

## 2018-08-04 NOTE — ED Triage Notes (Signed)
Patient in today c/o skin lesion on his back that he noticed today. Patient states that is is painful to the touch or movement. Patient also states that it has been draining.

## 2018-08-04 NOTE — ED Provider Notes (Signed)
[facility]    CSN: [id number] Arrival date & time: 08/04/18  1803     History   Chief Complaint Chief Complaint  Patient presents with  . skin lesion    HPI Elijah Wells is a 27 y.o. male.   Onset of recurrent abscess and pain today and his mother looked at it and told him to come here to be checked. He has had this area swell and drain on its own about 6-7 months for the past 2 years, but resolves on its own and never had to seek care.      Past Medical History:  Diagnosis Date  . Diabetes mellitus without complication (HCC)   . Hypertension   . Morbid obesity (HCC)   . Synovitis of ankle 09/20/2017   Seen by Dr. Orland Jarred    Patient Active Problem List   Diagnosis Date Noted  . 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 Essential hypertension 09/20/2017  . Gastroesophageal reflux disease 09/20/2017  . BMI 50.0-59.9, adult (HCC) 09/20/2017    Past Surgical History:  Procedure Laterality Date  . none         Home Medications    Prior to Admission medications   Medication Sig Start Date End Date Taking? Authorizing Provider  hydrochlorothiazide (HYDRODIURIL) 25 MG tablet Take 1 tablet (25 mg total) by mouth daily. 10/09/17  Yes Reubin Milan, MD  lisinopril (PRINIVIL,ZESTRIL) 20 MG tablet Take 1 tablet (20 mg total) by mouth daily. 10/09/17  Yes Reubin Milan, MD  omeprazole (PRILOSEC) 20 MG capsule Take 20 mg by mouth daily as needed.   Yes [provider]  sitaGLIPtin-metformin (JANUMET) 50-1000 MG tablet Take 1 tablet by mouth 2 (two) times daily with a meal.   Yes [provider]  doxycycline (VIBRA-TABS) 100 MG tablet Take 1 tablet (100 mg total) by mouth 2 (two) times daily. 08/04/18   Rodriguez-Southworth, Nettie Elm, PA-C    Family History Family History  Problem Relation Age of Onset  . Healthy Mother   . Healthy Father   . Breast cancer Maternal Grandmother     Social History Social  History   Tobacco Use  . Smoking status: Never Smoker  . Smokeless tobacco: Never Used  Substance Use Topics  . Alcohol use: No  . Drug use: No     Allergies   Patient has no known allergies.   Review of Systems Review of Systems  Constitutional: Negative for chills, diaphoresis and fever.  Musculoskeletal: Negative for arthralgias.  Skin: Positive for wound. Negative for rash.     Physical Exam Triage Vital Signs ED Triage Vitals  Enc Vitals Group     BP 08/04/18 1819 130/78     Pulse Rate 08/04/18 1819 (!) 107     Resp 08/04/18 1819 16     Temp 08/04/18 1819 99.4 F (37.4 C)     Temp Source 08/04/18 1819 Oral     SpO2 08/04/18 1819 100 %     Weight 08/04/18 1818 (!) 373 lb (169.2 kg)     Height 08/04/18 1818 5\' 5"  (1.651 m)     Head Circumference --      Peak Flow --      Pain Score 08/04/18 1818 4     Pain Loc --      Pain Edu? --      Excl. in GC? --    No data found.  Updated Vital Signs BP  130/78 (BP Location: Left Arm)   Pulse (!) 107   Temp 99.4 F (37.4 C) (Oral)   Resp 16   Ht 5\' 5"  (1.651 m)   Wt (!) 373 lb (169.2 kg)   SpO2 100%   BMI 62.07 kg/m   Visual Acuity Right Eye Distance:   Left Eye Distance:   Bilateral Distance:    Right Eye Near:   Left Eye Near:    Bilateral Near:     Physical Exam  Constitutional: He is oriented to person, place, and time. He appears well-developed and well-nourished. No distress.  HENT:  Head: Normocephalic.  Right Ear: External ear normal.  Left Ear: External ear normal.  Nose: Nose normal.  Eyes: Conjunctivae are normal. No scleral icterus.  Neck: Neck supple.  Pulmonary/Chest: Effort normal.  Neurological: He is alert and oriented to person, place, and time.  Skin: Skin is warm and dry.  BACK- on mid L lumbar region has induration of about 5x6 inches and in the center 2 fluctuant 1.5 x 1 cm masses.     Psychiatric: He has a normal mood and affect. His behavior is normal. Judgment and thought  content normal.  Nursing note and vitals reviewed.    UC Treatments / Results  Labs (all labs ordered are listed, but only abnormal results are displayed) Labs Reviewed  AEROBIC CULTURE (SUPERFICIAL SPECIMEN)    EKG None  Radiology No results found.  Procedures Incision and Drainage Date/Time: 08/04/2018 7:19 PM Performed by: Garey Ham, PA-C Authorized by: Garey Ham, PA-C   Procedure type:    Complexity:  Complex (skin was scrubbed with betadine and sterile technique used) Procedure details:    Needle aspiration: no     Incision types:  Stab incision and single straight   Incision depth:  Subcutaneous   Scalpel blade:  15   Wound management:  Probed and deloculated, irrigated with saline and extensive cleaning   Drainage:  Purulent and serosanguinous   Drainage amount:  Copious   Wound treatment:  Drain placed   Packing materials:  1/2 in iodoform gauze   Amount 1/2" iodoform:   6 inches total, 3 inches on each incision  Post-procedure details:    Patient tolerance of procedure:  Tolerated well, no immediate complications     Medications Ordered in UC Medications - No data to display  Initial Impression / Assessment and Plan / UC Course  I have reviewed the triage vital signs and the nursing notes. Chronic recurrent back abscess- He ws placed on Doxy as noted. Would culture sent out.  Needs to Fu with PCP in 2 days or come here.    Final Clinical Impressions(s) / UC Diagnoses   Final diagnoses:  Cellulitis and abscess of trunk     Discharge Instructions     Keep the dressing one we placed on you for today, unless it gets soaked, then changed the outer dressing. Then change it 1-2 times a day as needed if it gets soaked.  Follow up with your family Dr in 2 days, or come here.  Only wash cloth body washing.     ED Prescriptions    Medication Sig Dispense Auth. Provider   doxycycline (VIBRA-TABS) 100 MG tablet  Take 1 tablet (100 mg total) by mouth 2 (two) times daily. 20 tablet Rodriguez-Southworth, Nettie Elm, PA-C     Controlled Substance Prescriptions [place] Controlled Substance Registry consulted?    Garey Ham, [place] 08/04/18 1932

## 2018-08-04 NOTE — Discharge Instructions (Addendum)
Keep the dressing one we placed on you for today, unless it gets soaked, then changed the outer dressing. Then change it 1-2 times a day as needed if it gets soaked.  Follow up with your family Dr in 2 days, or come here.  Only wash cloth body washing.

## 2018-08-05 ENCOUNTER — Ambulatory Visit
Admission: EM | Admit: 2018-08-05 | Discharge: 2018-08-05 | Disposition: A | Discharge status: Home / Self Care | Payer: Managed Care, Other (non HMO)

## 2018-08-06 ENCOUNTER — Other Ambulatory Visit: Payer: Self-pay

## 2018-08-06 ENCOUNTER — Ambulatory Visit
Admission: EM | Admit: 2018-08-06 | Discharge: 2018-08-06 | Disposition: A | Discharge status: Home / Self Care | Payer: Managed Care, Other (non HMO) | Attending: Emergency Medicine | Admitting: Emergency Medicine

## 2018-08-06 ENCOUNTER — Encounter: Payer: Self-pay | Admitting: Emergency Medicine

## 2018-08-06 DIAGNOSIS — Z5189 Encounter for other specified aftercare: Secondary | ICD-10-CM

## 2018-08-06 NOTE — ED Triage Notes (Signed)
Patient in today for re-packing of wound on his back from 08/04/18. Patient is taking antibiotics as directed.

## 2018-08-06 NOTE — Discharge Instructions (Addendum)
Return here or follow-up with your doctor for packing removal in 2 days.

## 2018-08-06 NOTE — ED Triage Notes (Signed)
Patient states the packing came out yesterday.

## 2018-08-06 NOTE — ED Provider Notes (Signed)
HPI  SUBJECTIVE:  Elijah Wells is a 27 y.o. male who presents for wound check 2 days status post I&D of his right back.  He states the packing fell out yesterday.  He states that he is overall getting better.  He reports decreased pain, no fevers.  States that it has been draining pus and blood.  States he is compliant with the doxycycline prescribed to him at his last visit.  No body aches, generalized weakness, he is not needing any pain medications.  IO:[id number], Nyoka Cowden, MD   Past Medical History:  Diagnosis Date  . Diabetes mellitus without complication (HCC)   . Hypertension   . Morbid obesity (HCC)   . Synovitis of ankle 09/20/2017   Seen by Dr. Orland Jarred    Past Surgical History:  Procedure Laterality Date  . none      Family History  Problem Relation Age of Onset  . Healthy Mother   . Healthy Father   . Breast cancer Maternal Grandmother     Social History   Tobacco Use  . Smoking status: Never Smoker  . Smokeless tobacco: Never Used  Substance Use Topics  . Alcohol use: No  . Drug use: No    No current facility-administered medications for this encounter.   Current Outpatient Medications:  .  doxycycline (VIBRA-TABS) 100 MG tablet, Take 1 tablet (100 mg total) by mouth 2 (two) times daily., Disp: 20 tablet, Rfl: 0 .  hydrochlorothiazide (HYDRODIURIL) 25 MG tablet, Take 1 tablet (25 mg total) by mouth daily., Disp: 90 tablet, Rfl: 1 .  lisinopril (PRINIVIL,ZESTRIL) 20 MG tablet, Take 1 tablet (20 mg total) by mouth daily., Disp: 90 tablet, Rfl: 1 .  omeprazole (PRILOSEC) 20 MG capsule, Take 20 mg by mouth daily as needed., Disp: , Rfl:  .  sitaGLIPtin-metformin (JANUMET) 50-1000 MG tablet, Take 1 tablet by mouth 2 (two) times daily with a meal., Disp: , Rfl:   No Known Allergies   ROS  As noted in HPI.   Physical Exam  BP 126/68 (BP Location: Left Arm)   Pulse 99   Temp 98.6 F (37 C) (Oral)   Resp 16   Ht 5\' 5"  (1.651 m)   Wt (!) 169.2 kg   SpO2  98%   BMI 62.07 kg/m   Constitutional: Well developed, well nourished, no acute distress Eyes:  EOMI, conjunctiva normal bilaterally HENT: Normocephalic, atraumatic,mucus membranes moist Respiratory: Normal inspiratory effort Cardiovascular: Normal rate GI: nondistended skin: Positive mildly tender area of induration right lower back, 2 incisions.  No erythema.  Packing absent.  No expressible purulent drainage. Musculoskeletal: no deformities Neurologic: Alert & oriented x 3, no focal neuro deficits Psychiatric: Speech and behavior appropriate   ED Course   Medications - No data to display  No orders of the defined types were placed in this encounter.   No results found for this or any previous visit (from the past 24 hour(s)). No results found.  ED Clinical Impression  Wound check, abscess   ED Assessment/Plan  Patient with an abscess.  Seems to responding well to doxycycline.  Wound culture and sensitivities are still pending.  There is no growth at 1 day.  Reviewed this with patient and parent.  Advised him that we will call them if we need to change his antibiotics.  Will repack because he is diabetic and there were several sinuses noted on physical exam.    Procedure note: Cleaned area with alcohol.  Then explored with  sterile forceps and was able to locate several sinuses.  There was no expressible purulent drainage.  Repacked both wound sites and had a dressing placed.  Patient tolerated procedure well.  Return here in 2 days for packing removal, do not think that he will need repacking at that time.  He also states that he is an appointment with his primary care physician on Friday as well, advised him that his PMD may remove the packing.  No orders of the defined types were placed in this encounter.   *This clinic note was created using Dragon dictation software. Therefore, there may be occasional mistakes despite careful proofreading.   ?   Domenick Gong, MD 08/06/18 2012

## 2018-08-07 LAB — AEROBIC CULTURE  (SUPERFICIAL SPECIMEN): CULTURE: NO GROWTH

## 2018-08-07 LAB — AEROBIC CULTURE W GRAM STAIN (SUPERFICIAL SPECIMEN)

## 2018-08-08 ENCOUNTER — Other Ambulatory Visit: Payer: Self-pay

## 2018-08-08 ENCOUNTER — Ambulatory Visit
Admission: EM | Admit: 2018-08-08 | Discharge: 2018-08-08 | Disposition: A | Discharge status: Home / Self Care | Payer: Managed Care, Other (non HMO) | Attending: Family Medicine | Admitting: Family Medicine

## 2018-08-08 ENCOUNTER — Ambulatory Visit (INDEPENDENT_AMBULATORY_CARE_PROVIDER_SITE_OTHER): Payer: Managed Care, Other (non HMO) | Admitting: Internal Medicine

## 2018-08-08 ENCOUNTER — Encounter: Payer: Self-pay | Admitting: Internal Medicine

## 2018-08-08 VITALS — BP 132/80 | HR 89 | Ht 65.0 in | Wt 377.0 lb

## 2018-08-08 DIAGNOSIS — Z23 Encounter for immunization: Secondary | ICD-10-CM

## 2018-08-08 DIAGNOSIS — Z5189 Encounter for other specified aftercare: Secondary | ICD-10-CM | POA: Diagnosis not present

## 2018-08-08 DIAGNOSIS — L02212 Cutaneous abscess of back [any part, except buttock]: Secondary | ICD-10-CM | POA: Diagnosis not present

## 2018-08-08 DIAGNOSIS — N181 Chronic kidney disease, stage 1: Secondary | ICD-10-CM

## 2018-08-08 DIAGNOSIS — I129 Hypertensive chronic kidney disease with stage 1 through stage 4 chronic kidney disease, or unspecified chronic kidney disease: Secondary | ICD-10-CM | POA: Diagnosis not present

## 2018-08-08 DIAGNOSIS — E1122 Type 2 diabetes mellitus with diabetic chronic kidney disease: Secondary | ICD-10-CM

## 2018-08-08 MED ORDER — SITAGLIPTIN PHOS-METFORMIN HCL 50-1000 MG PO TABS: 1.0000 | ORAL_TABLET | Freq: Two times a day (BID) | ORAL | 1 refills | Status: DC

## 2018-08-08 MED ORDER — MUPIROCIN 2 % EX OINT: 1.0000 "application " | TOPICAL_OINTMENT | Freq: Three times a day (TID) | CUTANEOUS | 0 refills | Status: DC

## 2018-08-08 NOTE — Discharge Instructions (Addendum)
Wash the area daily.  Dry thoroughly and apply Bactroban ointment.  Continue applying Bactroban ointment 3 times daily until the wound has healed.  You notice any worsening or delay in healing return to our clinic for further evaluation.

## 2018-08-08 NOTE — Progress Notes (Signed)
Date:  08/08/2018   Name:  Elijah Wells   DOB:  Mar 08, 1991   MRN:  [id number]   Chief Complaint: Wound Check (Seen UC and had Abcess from back drained. Wanted to follow up with PCP and make sure it is healing good. In no pain. Wanted to follow up with PCP. UC will pull out packing today after leaving here. )  Diabetes  He presents for his follow-up diabetic visit. He has type 2 diabetes mellitus. His disease course has been stable. Pertinent negatives for hypoglycemia include no nervousness/anxiousness. Pertinent negatives for diabetes include no chest pain and no fatigue. Current diabetic treatment includes oral agent (dual therapy). He is compliant with treatment all of the time. He monitors blood glucose at home 1-2 x per day. His breakfast blood glucose is taken between 7-8 am. His breakfast blood glucose range is generally 110-130 mg/dl.   Abscess -  Drained by UC earlier this week.  Still has packing that he will have them remove.  On Doxycycline without side effects.  He feels well with no pain or drainage.  CX returned negative.  Lab Results  Component Value Date   HGBA1C 6.5 (H) 05/27/2018    Review of Systems  Constitutional: Negative for chills, fatigue and fever.  Respiratory: Negative for chest tightness and shortness of breath.   Cardiovascular: Negative for chest pain and palpitations.  Skin: Positive for wound.  Psychiatric/Behavioral: Negative for dysphoric mood and sleep disturbance. The patient is not nervous/anxious.     Patient Active Problem List   Diagnosis Date Noted  . 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 Essential hypertension 09/20/2017  . Gastroesophageal reflux disease 09/20/2017  . BMI 50.0-59.9, adult (HCC) 09/20/2017    No Known Allergies  Past Surgical History:  Procedure Laterality Date  . none      Social History   Tobacco Use  . Smoking status: Never Smoker  . Smokeless tobacco: Never Used  Substance  Use Topics  . Alcohol use: No  . Drug use: No     Medication list has been reviewed and updated.  Current Meds  Medication Sig  . doxycycline (VIBRA-TABS) 100 MG tablet Take 1 tablet (100 mg total) by mouth 2 (two) times daily.  . hydrochlorothiazide (HYDRODIURIL) 25 MG tablet Take 1 tablet (25 mg total) by mouth daily.  Marland Kitchen lisinopril (PRINIVIL,ZESTRIL) 20 MG tablet Take 1 tablet (20 mg total) by mouth daily.  Marland Kitchen omeprazole (PRILOSEC) 20 MG capsule Take 20 mg by mouth daily as needed.  . sitaGLIPtin-metformin (JANUMET) 50-1000 MG tablet Take 1 tablet by mouth 2 (two) times daily with a meal.    PHQ 2/9 Scores 09/20/2017  PHQ - 2 Score 0    Physical Exam  Constitutional: He is oriented to person, place, and time. He appears well-developed. No distress.  HENT:  Head: Normocephalic and atraumatic.  Neck: Normal range of motion. Neck supple.  Cardiovascular: Normal rate, regular rhythm and normal heart sounds.  Pulmonary/Chest: Effort normal and breath sounds normal. No respiratory distress.  Musculoskeletal: Normal range of motion.  Neurological: He is alert and oriented to person, place, and time.  Skin: Skin is warm and dry. No rash noted.     Psychiatric: He has a normal mood and affect. His behavior is normal. Thought content normal.  Nursing note and vitals reviewed.   BP 132/80 (BP Location: Right Arm, Patient Position: Sitting, Cuff Size: Large)   Pulse 89  Ht 5\' 5"  (1.651 m)   Wt (!) 377 lb (171 kg)   SpO2 98%   BMI 62.74 kg/m   Assessment and Plan: 1. 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  Dispense: 180 tablet; Refill: 1  2. Need for immunization against influenza - Flu Vaccine QUAD 36+ mos IM  3. Abscess of back Finish antibiotics Packing to be removed by UC   Partially dictated using Barista. Any errors are unintentional.  Bari Edward, MD [facility]  08/08/2018

## 2018-08-08 NOTE — ED Provider Notes (Addendum)
[facility]    CSN: [id number] Arrival date & time: 08/08/18  1656     History   Chief Complaint Chief Complaint  Patient presents with  . Wound Check    HPI Elijah Wells is a 27 y.o. male.   HPI  27 year old male presents here for packing removal.  He had an abscess on his lower back midline.  Packing was removed/ replaced 2 days ago.  States he has been doing well.  His mother has changed the dressing.  He states that he is feeling much better.         Past Medical History:  Diagnosis Date  . Diabetes mellitus without complication (HCC)   . Hypertension   . Morbid obesity (HCC)   . Synovitis of ankle 09/20/2017   Seen by Dr. Orland Jarred    Patient Active Problem List   Diagnosis Date Noted  . 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 Essential hypertension 09/20/2017  . Gastroesophageal reflux disease 09/20/2017  . BMI 50.0-59.9, adult (HCC) 09/20/2017    Past Surgical History:  Procedure Laterality Date  . none         Home Medications    Prior to Admission medications   Medication Sig Start Date End Date Taking? Authorizing Provider  doxycycline (VIBRA-TABS) 100 MG tablet Take 1 tablet (100 mg total) by mouth 2 (two) times daily. 08/04/18  Yes Rodriguez-Southworth, Nettie Elm, PA-C  hydrochlorothiazide (HYDRODIURIL) 25 MG tablet Take 1 tablet (25 mg total) by mouth daily. 10/09/17  Yes Reubin Milan, MD  lisinopril (PRINIVIL,ZESTRIL) 20 MG tablet Take 1 tablet (20 mg total) by mouth daily. 10/09/17  Yes Reubin Milan, MD  omeprazole (PRILOSEC) 20 MG capsule Take 20 mg by mouth daily as needed.   Yes [provider]  sitaGLIPtin-metformin (JANUMET) 50-1000 MG tablet Take 1 tablet by mouth 2 (two) times daily with a meal. 08/08/18  Yes Reubin Milan, MD  mupirocin ointment (BACTROBAN) 2 % Apply 1 application topically 3 (three) times daily. 08/08/18   Lutricia Feil, PA-C    Family History Family  History  Problem Relation Age of Onset  . Healthy Mother   . Healthy Father   . Breast cancer Maternal Grandmother     Social History Social History   Tobacco Use  . Smoking status: Never Smoker  . Smokeless tobacco: Never Used  Substance Use Topics  . Alcohol use: No  . Drug use: No     Allergies   Patient has no known allergies.   Review of Systems Review of Systems  Constitutional: Positive for activity change. Negative for appetite change, chills, fatigue and fever.  Skin: Positive for wound.  All other systems reviewed and are negative.    Physical Exam Triage Vital Signs ED Triage Vitals  Enc Vitals Group     BP 08/08/18 1707 121/76     Pulse Rate 08/08/18 1707 99     Resp 08/08/18 1707 18     Temp 08/08/18 1707 98.3 F (36.8 C)     Temp Source 08/08/18 1707 Oral     SpO2 08/08/18 1707 99 %     Weight 08/08/18 1706 (!) 377 lb (171 kg)     Height 08/08/18 1706 5\' 5"  (1.651 m)     Head Circumference --      Peak Flow --      Pain Score 08/08/18 1706 0     Pain Loc --  Pain Edu? --      Excl. in GC? --    No data found.  Updated Vital Signs BP 121/76 (BP Location: Left Arm)   Pulse 99   Temp 98.3 F (36.8 C) (Oral)   Resp 18   Ht 5\' 5"  (1.651 m)   Wt (!) 377 lb (171 kg)   SpO2 99%   BMI 62.74 kg/m    Visual Acuity Right Eye Distance:   Left Eye Distance:   Bilateral Distance:    Right Eye Near:   Left Eye Near:    Bilateral Near:     Physical Exam  Constitutional: He is oriented to person, place, and time. He appears well-developed and well-nourished. No distress.  HENT:  Head: Normocephalic.  Eyes: Pupils are equal, round, and reactive to light. Right eye exhibits no discharge. Left eye exhibits no discharge.  Neck: Normal range of motion.  Musculoskeletal: Normal range of motion.  Neurological: He is alert and oriented to person, place, and time.  Skin: Skin is warm and dry. He is not diaphoretic.  Psychiatric: He has a  normal mood and affect. His behavior is normal. Judgment and thought content normal.  Nursing note and vitals reviewed.        UC Treatments / Results  Labs (all labs ordered are listed, but only abnormal results are displayed) Labs Reviewed - No data to display  EKG None  Radiology No results found.  Procedures  The dressing was removed.  There is no  drain present.  2 separate areas of erythema and hypertrophic skin.  2 small opening in each of the wounds.  A small amount of serosanguineous fluid was expressed.  No purulence was expressed.  There is no significant induration or fluctuance present.Wound was prepped widely with antibiotic solution after band ointment was then placed in a dry dressing on top.  Medications Ordered in UC Medications - No data to display  Initial Impression / Assessment and Plan / UC Course  I have reviewed the triage vital signs and the nursing notes.  Pertinent labs & imaging results that were available during my care of the patient were reviewed by me and considered in my medical decision making (see chart for details).     This point time no repacking is necessary.  We will start him on a washing program with application of Bactroban ointment.  Complete the doxycycline.  He return to our clinic for any problems but otherwise expected to heal totally. Final Clinical Impressions(s) / UC Diagnoses   Final diagnoses:  Wound check, abscess     Discharge Instructions     Wash the area daily.  Dry thoroughly and apply Bactroban ointment.  Continue applying Bactroban ointment 3 times daily until the wound has healed.  You notice any worsening or delay in healing return to our clinic for further evaluation.   ED Prescriptions    Medication Sig Dispense Auth. Provider   mupirocin ointment (BACTROBAN) 2 % Apply 1 application topically 3 (three) times daily. 22 g Lutricia Feil, PA-C     Controlled Substance Prescriptions [place] Controlled  Substance Registry consulted? Not Applicable  Lutricia Feil, PA-C 08/08/18 1809  Lutricia Feil, PA-C 08/08/18 1811

## 2018-08-08 NOTE — ED Triage Notes (Signed)
Patient is here for packing removal, placed 2 days ago. Patient states that area is doing well.

## 2018-09-26 ENCOUNTER — Ambulatory Visit: Payer: 59 | Admitting: Internal Medicine

## 2018-09-26 ENCOUNTER — Encounter: Payer: Self-pay | Admitting: Internal Medicine

## 2018-09-26 VITALS — BP 124/82 | HR 84 | Resp 16 | Ht 65.0 in | Wt 375.0 lb

## 2018-09-26 DIAGNOSIS — I129 Hypertensive chronic kidney disease with stage 1 through stage 4 chronic kidney disease, or unspecified chronic kidney disease: Secondary | ICD-10-CM | POA: Diagnosis not present

## 2018-09-26 DIAGNOSIS — E1122 Type 2 diabetes mellitus with diabetic chronic kidney disease: Secondary | ICD-10-CM | POA: Diagnosis not present

## 2018-09-26 DIAGNOSIS — N181 Chronic kidney disease, stage 1: Secondary | ICD-10-CM

## 2018-09-26 DIAGNOSIS — I10 Essential (primary) hypertension: Secondary | ICD-10-CM

## 2018-09-26 NOTE — Progress Notes (Signed)
Date:  09/26/2018   Name:  Elijah Wells   DOB:  02/02/1991   MRN:  [id number]   Chief Complaint: Diabetes  Diabetes  Pertinent negatives for hypoglycemia include no headaches or tremors. Pertinent negatives for diabetes include no chest pain, no fatigue, no polydipsia and no polyuria.  Hypertension  Pertinent negatives include no chest pain, headaches, palpitations or shortness of breath.   Lab Results  Component Value Date   HGBA1C 6.5 (H) 05/27/2018   Lab Results  Component Value Date   CREATININE 1.07 09/20/2017   BUN 14 09/20/2017   NA 141 09/20/2017   K 4.3 09/20/2017   CL 100 09/20/2017   CO2 26 09/20/2017   Lab Results  Component Value Date   CHOL 114 05/27/2018   HDL 38 (L) 05/27/2018   LDLCALC 63 05/27/2018   TRIG 66 05/27/2018   CHOLHDL 3.0 05/27/2018     Review of Systems  Constitutional: Negative for appetite change, fatigue and unexpected weight change.  Eyes: Negative for visual disturbance.  Respiratory: Negative for cough, shortness of breath and wheezing.   Cardiovascular: Negative for chest pain, palpitations and leg swelling.  Gastrointestinal: Negative for abdominal pain and blood in stool.  Endocrine: Negative for polydipsia and polyuria.  Genitourinary: Negative for dysuria and hematuria.  Skin: Negative for color change and rash.  Neurological: Negative for tremors, numbness and headaches.  Psychiatric/Behavioral: Negative for dysphoric mood and sleep disturbance.    Patient Active Problem List   Diagnosis Date Noted  . 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 Essential hypertension 09/20/2017  . Gastroesophageal reflux disease 09/20/2017  . BMI 50.0-59.9, adult (HCC) 09/20/2017    No Known Allergies  Past Surgical History:  Procedure Laterality Date  . none      Social History   Tobacco Use  . Smoking status: Never Smoker  . Smokeless tobacco: Never Used  Substance Use Topics  . Alcohol use: No    . Drug use: No     Medication list has been reviewed and updated.  Current Meds  Medication Sig  . hydrochlorothiazide (HYDRODIURIL) 25 MG tablet Take 1 tablet (25 mg total) by mouth daily.  Marland Kitchen. lisinopril (PRINIVIL,ZESTRIL) 20 MG tablet Take 1 tablet (20 mg total) by mouth daily.  Marland Kitchen. omeprazole (PRILOSEC) 20 MG capsule Take 20 mg by mouth daily as needed.  . sitaGLIPtin-metformin (JANUMET) 50-1000 MG tablet Take 1 tablet by mouth 2 (two) times daily with a meal.  . [DISCONTINUED] mupirocin ointment (BACTROBAN) 2 % Apply 1 application topically 3 (three) times daily.    PHQ 2/9 Scores 09/20/2017  PHQ - 2 Score 0   Wt Readings from Last 3 Encounters:  09/26/18 (!) 375 lb (170.1 kg)  08/08/18 (!) 377 lb (171 kg)  08/08/18 (!) 377 lb (171 kg)     Physical Exam Vitals signs and nursing note reviewed.  Constitutional:      General: He is not in acute distress.    Appearance: He is well-developed.  HENT:     Head: Normocephalic and atraumatic.  Cardiovascular:     Rate and Rhythm: Normal rate and regular rhythm.     Pulses: Normal pulses.  Pulmonary:     Effort: Pulmonary effort is normal. No respiratory distress.  Musculoskeletal: Normal range of motion.  Skin:    General: Skin is warm and dry.     Findings: No rash.  Neurological:     Mental Status: He  is alert and oriented to person, place, and time.  Psychiatric:        Behavior: Behavior normal.        Thought Content: Thought content normal.     BP 124/82   Pulse 84   Resp 16   Ht 5\' 5"  (1.651 m)   Wt (!) 375 lb (170.1 kg)   SpO2 98%   BMI 62.40 kg/m   Assessment and Plan: 1. 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 Essential hypertension controlled - Comprehensive metabolic panel   Partially dictated using Dragon software. Any errors are  unintentional.  Bari Edward, MD [facility]  09/26/2018

## 2018-09-27 LAB — COMPREHENSIVE METABOLIC PANEL
A/G RATIO: 1.4 (ref 1.2–2.2)
ALT: 52 IU/L — ABNORMAL HIGH (ref 0–44)
AST: 39 IU/L (ref 0–40)
Albumin: 4.4 g/dL (ref 3.5–5.5)
Alkaline Phosphatase: 68 IU/L (ref 39–117)
BILIRUBIN TOTAL: 0.5 mg/dL (ref 0.0–1.2)
BUN/Creatinine Ratio: 11 (ref 9–20)
BUN: 11 mg/dL (ref 6–20)
CHLORIDE: 101 mmol/L (ref 96–106)
CO2: 26 mmol/L (ref 20–29)
Calcium: 10 mg/dL (ref 8.7–10.2)
Creatinine, Ser: 1.03 mg/dL (ref 0.76–1.27)
GFR calc Af Amer: 114 mL/min/{1.73_m2} (ref >59)
GFR calc non Af Amer: 98 mL/min/{1.73_m2} (ref >59)
Globulin, Total: 3.1 g/dL (ref 1.5–4.5)
Glucose: 120 mg/dL — ABNORMAL HIGH (ref 65–99)
POTASSIUM: 3.9 mmol/L (ref 3.5–5.2)
SODIUM: 143 mmol/L (ref 134–144)
Total Protein: 7.5 g/dL (ref 6.0–8.5)

## 2018-09-27 LAB — HEMOGLOBIN A1C
ESTIMATED AVERAGE GLUCOSE: 140 mg/dL
HEMOGLOBIN A1C: 6.5 % — AB (ref 4.8–5.6)

## 2018-09-29 NOTE — Progress Notes (Signed)
Informed patient on VM of good labs and kidney and liver function normal. Told to call back with any questions.

## 2019-01-09 ENCOUNTER — Other Ambulatory Visit: Payer: Self-pay

## 2019-01-09 ENCOUNTER — Encounter: Payer: Self-pay | Admitting: Internal Medicine

## 2019-01-09 MED ORDER — LISINOPRIL 20 MG PO TABS: 20.0000 mg | ORAL_TABLET | Freq: Every day | ORAL | 1 refills | Status: DC

## 2019-01-09 MED ORDER — HYDROCHLOROTHIAZIDE 25 MG PO TABS: 25.0000 mg | ORAL_TABLET | Freq: Every day | ORAL | 1 refills | Status: DC

## 2019-01-14 HISTORY — PX: WISDOM TOOTH EXTRACTION: SHX21

## 2019-02-11 ENCOUNTER — Other Ambulatory Visit: Payer: Self-pay | Admitting: Internal Medicine

## 2019-02-11 DIAGNOSIS — N181 Chronic kidney disease, stage 1: Principal | ICD-10-CM

## 2019-02-11 DIAGNOSIS — I129 Hypertensive chronic kidney disease with stage 1 through stage 4 chronic kidney disease, or unspecified chronic kidney disease: Principal | ICD-10-CM

## 2019-02-11 DIAGNOSIS — E1122 Type 2 diabetes mellitus with diabetic chronic kidney disease: Secondary | ICD-10-CM

## 2019-04-06 ENCOUNTER — Encounter: Payer: Self-pay | Admitting: Internal Medicine

## 2019-04-07 ENCOUNTER — Encounter: Payer: 59 | Admitting: Internal Medicine

## 2019-05-23 ENCOUNTER — Encounter: Payer: Self-pay | Admitting: Internal Medicine

## 2019-05-24 ENCOUNTER — Other Ambulatory Visit: Payer: Self-pay | Admitting: Internal Medicine

## 2019-05-24 DIAGNOSIS — E1122 Type 2 diabetes mellitus with diabetic chronic kidney disease: Secondary | ICD-10-CM

## 2019-05-24 MED ORDER — JANUMET 50-1000 MG PO TABS: 1.0000 | ORAL_TABLET | Freq: Two times a day (BID) | ORAL | 1 refills | Status: DC

## 2019-05-24 MED ORDER — JANUMET 50-1000 MG PO TABS: 1.0000 | ORAL_TABLET | Freq: Two times a day (BID) | ORAL | 0 refills | Status: DC

## 2019-06-20 ENCOUNTER — Other Ambulatory Visit: Payer: Self-pay | Admitting: Internal Medicine

## 2019-07-24 ENCOUNTER — Other Ambulatory Visit: Payer: Self-pay

## 2019-07-24 ENCOUNTER — Ambulatory Visit (INDEPENDENT_AMBULATORY_CARE_PROVIDER_SITE_OTHER): Payer: Managed Care, Other (non HMO) | Admitting: Internal Medicine

## 2019-07-24 ENCOUNTER — Encounter: Payer: Self-pay | Admitting: Internal Medicine

## 2019-07-24 VITALS — BP 114/74 | HR 88 | Ht 65.0 in | Wt 390.0 lb

## 2019-07-24 DIAGNOSIS — K219 Gastro-esophageal reflux disease without esophagitis: Secondary | ICD-10-CM

## 2019-07-24 DIAGNOSIS — Z0000 Encounter for general adult medical examination without abnormal findings: Secondary | ICD-10-CM

## 2019-07-24 DIAGNOSIS — E1122 Type 2 diabetes mellitus with diabetic chronic kidney disease: Secondary | ICD-10-CM | POA: Diagnosis not present

## 2019-07-24 DIAGNOSIS — Z23 Encounter for immunization: Secondary | ICD-10-CM | POA: Diagnosis not present

## 2019-07-24 DIAGNOSIS — I129 Hypertensive chronic kidney disease with stage 1 through stage 4 chronic kidney disease, or unspecified chronic kidney disease: Secondary | ICD-10-CM

## 2019-07-24 DIAGNOSIS — I10 Essential (primary) hypertension: Secondary | ICD-10-CM | POA: Diagnosis not present

## 2019-07-24 DIAGNOSIS — Z6841 Body Mass Index (BMI) 40.0 and over, adult: Secondary | ICD-10-CM

## 2019-07-24 DIAGNOSIS — N181 Chronic kidney disease, stage 1: Secondary | ICD-10-CM

## 2019-07-24 LAB — POCT URINALYSIS DIPSTICK
Bilirubin, UA: NEGATIVE
Blood, UA: NEGATIVE
Glucose, UA: NEGATIVE
Ketones, UA: NEGATIVE
Leukocytes, UA: NEGATIVE
Nitrite, UA: NEGATIVE
Protein, UA: POSITIVE — AB
Spec Grav, UA: >=1.03 — AB (ref 1.010–1.025)
Urobilinogen, UA: 0.2 E.U./dL
pH, UA: 6 (ref 5.0–8.0)

## 2019-07-24 NOTE — Progress Notes (Signed)
Date:  07/24/2019   Name:  Elijah Wells   DOB:  December 06, 1990   MRN:  [id number]   Chief Complaint: Annual Exam (Flu shot. Pneumonia 23), Diabetes, and Hypertension LAYDEN CATERINO is a 28 y.o. male who presents today for his Complete Annual Exam. He feels well. He reports exercising very little. He reports he is sleeping well.   PPV-23 due Flu vaccine due  Diabetes He presents for his follow-up diabetic visit. He has type 2 diabetes mellitus. His disease course has been stable. Pertinent negatives for hypoglycemia include no dizziness or headaches. Pertinent negatives for diabetes include no chest pain, no fatigue, no foot ulcerations, no visual change and no weight loss. Symptoms are stable. Current diabetic treatment includes oral agent (dual therapy) (janumet). He is compliant with treatment all of the time. His weight is increasing steadily. He is following a generally healthy diet. When asked about meal planning, he reported none. He monitors blood glucose at home 1-2 x per week. There is no change in his home blood glucose trend. His breakfast blood glucose range is generally 110-130 mg/dl. An ACE inhibitor/angiotensin II receptor blocker is being taken. Eye exam is not current.  Hypertension This is a chronic problem. The problem is controlled. Pertinent negatives include no chest pain, headaches, palpitations or shortness of breath. Risk factors for coronary artery disease include diabetes mellitus, sedentary lifestyle, obesity and male gender. Past treatments include ACE inhibitors and diuretics.   Lab Results  Component Value Date   HGBA1C 6.5 (H) 09/26/2018   Lab Results  Component Value Date   CREATININE 1.03 09/26/2018   BUN 11 09/26/2018   NA 143 09/26/2018   K 3.9 09/26/2018   CL 101 09/26/2018   CO2 26 09/26/2018   Lab Results  Component Value Date   CHOL 114 05/27/2018   HDL 38 (L) 05/27/2018   LDLCALC 63 05/27/2018   TRIG 66 05/27/2018   CHOLHDL 3.0 05/27/2018      Review of Systems  Constitutional: Negative for appetite change, chills, diaphoresis, fatigue, unexpected weight change and weight loss.  HENT: Negative for hearing loss, tinnitus, trouble swallowing and voice change.   Eyes: Negative for visual disturbance.  Respiratory: Negative for choking, shortness of breath and wheezing.   Cardiovascular: Negative for chest pain, palpitations and leg swelling.  Gastrointestinal: Negative for abdominal pain, blood in stool, constipation and diarrhea.  Genitourinary: Negative for difficulty urinating, dysuria and frequency.  Musculoskeletal: Negative for arthralgias, back pain and myalgias.  Skin: Negative for color change and rash.  Allergic/Immunologic: Negative for environmental allergies.  Neurological: Negative for dizziness, syncope and headaches.  Hematological: Negative for adenopathy.  Psychiatric/Behavioral: Negative for dysphoric mood and sleep disturbance.    Patient Active Problem List   Diagnosis Date Noted  . 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 Essential hypertension 09/20/2017  . Gastroesophageal reflux disease 09/20/2017  . BMI 50.0-59.9, adult (HCC) 09/20/2017    No Known Allergies  Past Surgical History:  Procedure Laterality Date  . none      Social History   Tobacco Use  . Smoking status: Never Smoker  . Smokeless tobacco: Never Used  Substance Use Topics  . Alcohol use: No  . Drug use: No     Medication list has been reviewed and updated.  Current Meds  Medication Sig  . hydrochlorothiazide (HYDRODIURIL) 25 MG tablet TAKE 1 TABLET DAILY  . lisinopril (ZESTRIL) 20 MG tablet TAKE 1  TABLET DAILY  . omeprazole (PRILOSEC) 20 MG capsule Take 20 mg by mouth daily as needed.  . sitaGLIPtin-metformin (JANUMET) 50-1000 MG tablet Take 1 tablet by mouth 2 (two) times daily with a meal.    PHQ 2/9 Scores 07/24/2019 09/20/2017  PHQ - 2 Score 0 0    BP Readings from Last 3 Encounters:   07/24/19 114/74  09/26/18 124/82  08/08/18 121/76    Physical Exam Vitals signs and nursing note reviewed.  Constitutional:      Appearance: Normal appearance. He is well-developed. He is obese.  HENT:     Head: Normocephalic.     Right Ear: Tympanic membrane, ear canal and external ear normal.     Left Ear: Tympanic membrane, ear canal and external ear normal.     Nose: Nose normal.     Mouth/Throat:     Pharynx: Uvula midline.  Eyes:     Conjunctiva/sclera: Conjunctivae normal.     Pupils: Pupils are equal, round, and reactive to light.  Neck:     Musculoskeletal: Normal range of motion and neck supple.     Thyroid: No thyromegaly.     Vascular: No carotid bruit.  Cardiovascular:     Rate and Rhythm: Normal rate and regular rhythm.     Heart sounds: Normal heart sounds.  Pulmonary:     Effort: Pulmonary effort is normal.     Breath sounds: Normal breath sounds. No wheezing.  Chest:     Breasts:        Right: No mass.        Left: No mass.  Abdominal:     General: Bowel sounds are normal.     Palpations: Abdomen is soft.     Tenderness: There is no abdominal tenderness.  Musculoskeletal: Normal range of motion.     Right lower leg: No edema.     Left lower leg: No edema.  Lymphadenopathy:     Cervical: No cervical adenopathy.  Skin:    General: Skin is warm and dry.     Capillary Refill: Capillary refill takes less than 2 seconds.  Neurological:     General: No focal deficit present.     Mental Status: He is alert and oriented to person, place, and time.     Sensory: Sensation is intact.     Motor: Motor function is intact.     Deep Tendon Reflexes:     Reflex Scores:      Patellar reflexes are 2+ on the right side and 2+ on the left side.      Achilles reflexes are 1+ on the right side and 1+ on the left side. Psychiatric:        Speech: Speech normal.        Behavior: Behavior normal.        Thought Content: Thought content normal.        Judgment:  Judgment normal.     Wt Readings from Last 3 Encounters:  07/24/19 (!) 390 lb (176.9 kg)  09/26/18 (!) 375 lb (170.1 kg)  08/08/18 (!) 377 lb (171 kg)    BP 114/74   Pulse 88   Ht 5\' 5"  (1.651 m)   Wt (!) 390 lb (176.9 kg)   SpO2 96%   BMI 64.90 kg/m   Assessment and Plan: 1. Annual physical exam Normal exam except for weight - Lipid panel - POCT urinalysis dipstick  2. 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 DM complicated by HTN. Tolerating medications Janumet 50/1000 bid well without side effects or other concerns. - Comprehensive metabolic panel - Hemoglobin A1c  3. Essential hypertension Clinically stable exam with well controlled BP.   Tolerating medications, lisinopril 20 mg and HCTZ 25 mg, without side effects at this time. Pt to continue current regimen and low sodium diet; benefits of regular exercise as able discussed. - CBC with Differential/Platelet  4. Gastroesophageal reflux disease, unspecified whether esophagitis present Stable intermittent sx without red flag signs Pt will continue PPI PRN - CBC with Differential/Platelet  5. Need for vaccination for pneumococcus - Pneumococcal polysaccharide vaccine 23-valent greater than or equal to 2yo subcutaneous/IM  6. BMI 50.0-59.9, adult ([place]) Discussed weight and need for more regular exercise Advise walking 15 minutes per day to start then increase as able   Partially dictated using Editor, commissioning. Any errors are unintentional.  Halina Maidens, MD [facility]  07/24/2019

## 2019-07-25 LAB — COMPREHENSIVE METABOLIC PANEL
ALT: 104 IU/L — ABNORMAL HIGH (ref 0–44)
AST: 93 IU/L — ABNORMAL HIGH (ref 0–40)
Albumin/Globulin Ratio: 1.4 (ref 1.2–2.2)
Albumin: 4.3 g/dL (ref 4.1–5.2)
Alkaline Phosphatase: 80 IU/L (ref 39–117)
BUN/Creatinine Ratio: 8 — ABNORMAL LOW (ref 9–20)
BUN: 8 mg/dL (ref 6–20)
Bilirubin Total: 0.5 mg/dL (ref 0.0–1.2)
CO2: 25 mmol/L (ref 20–29)
Calcium: 9.7 mg/dL (ref 8.7–10.2)
Chloride: 99 mmol/L (ref 96–106)
Creatinine, Ser: 0.99 mg/dL (ref 0.76–1.27)
GFR calc Af Amer: 119 mL/min/{1.73_m2} (ref >59)
GFR calc non Af Amer: 103 mL/min/{1.73_m2} (ref >59)
Globulin, Total: 3.1 g/dL (ref 1.5–4.5)
Glucose: 164 mg/dL — ABNORMAL HIGH (ref 65–99)
Potassium: 4.1 mmol/L (ref 3.5–5.2)
Sodium: 137 mmol/L (ref 134–144)
Total Protein: 7.4 g/dL (ref 6.0–8.5)

## 2019-07-25 LAB — CBC WITH DIFFERENTIAL/PLATELET
Basophils Absolute: 0 10*3/uL (ref 0.0–0.2)
Basos: 0 %
EOS (ABSOLUTE): 0.2 10*3/uL (ref 0.0–0.4)
Eos: 2 %
Hematocrit: 42.7 % (ref 37.5–51.0)
Hemoglobin: 14 g/dL (ref 13.0–17.7)
Immature Grans (Abs): 0 10*3/uL (ref 0.0–0.1)
Immature Granulocytes: 0 %
Lymphocytes Absolute: 3.1 10*3/uL (ref 0.7–3.1)
Lymphs: 47 %
MCH: 26.8 pg (ref 26.6–33.0)
MCHC: 32.8 g/dL (ref 31.5–35.7)
MCV: 82 fL (ref 79–97)
Monocytes Absolute: 0.6 10*3/uL (ref 0.1–0.9)
Monocytes: 9 %
Neutrophils Absolute: 2.8 10*3/uL (ref 1.4–7.0)
Neutrophils: 42 %
Platelets: 323 10*3/uL (ref 150–450)
RBC: 5.23 x10E6/uL (ref 4.14–5.80)
RDW: 14.8 % (ref 11.6–15.4)
WBC: 6.7 10*3/uL (ref 3.4–10.8)

## 2019-07-25 LAB — LIPID PANEL
Chol/HDL Ratio: 3.3 ratio (ref 0.0–5.0)
Cholesterol, Total: 116 mg/dL (ref 100–199)
HDL: 35 mg/dL — ABNORMAL LOW (ref >39)
LDL Chol Calc (NIH): 64 mg/dL (ref 0–99)
Triglycerides: 87 mg/dL (ref 0–149)
VLDL Cholesterol Cal: 17 mg/dL (ref 5–40)

## 2019-07-25 LAB — HEMOGLOBIN A1C
Est. average glucose Bld gHb Est-mCnc: 235 mg/dL
Hgb A1c MFr Bld: 9.8 % — ABNORMAL HIGH (ref 4.8–5.6)

## 2019-07-29 NOTE — Progress Notes (Signed)
Patient agreed to try medication. Will get the coupon before going to the pharmacy to pick the medication up. Will call at the end of each 30 days to get increased dose sent in to pharmacy. [facility] [place] .

## 2019-07-30 ENCOUNTER — Other Ambulatory Visit: Payer: Self-pay | Admitting: Internal Medicine

## 2019-07-30 DIAGNOSIS — E1122 Type 2 diabetes mellitus with diabetic chronic kidney disease: Secondary | ICD-10-CM

## 2019-07-30 DIAGNOSIS — I129 Hypertensive chronic kidney disease with stage 1 through stage 4 chronic kidney disease, or unspecified chronic kidney disease: Secondary | ICD-10-CM

## 2019-07-30 DIAGNOSIS — N181 Chronic kidney disease, stage 1: Secondary | ICD-10-CM

## 2019-07-30 MED ORDER — RYBELSUS 3 MG PO TABS: 1.0000 | ORAL_TABLET | Freq: Every day | ORAL | 0 refills | Status: DC

## 2019-10-11 ENCOUNTER — Encounter: Payer: Self-pay | Admitting: Internal Medicine

## 2019-10-12 ENCOUNTER — Other Ambulatory Visit: Payer: Self-pay | Admitting: Internal Medicine

## 2019-10-12 DIAGNOSIS — I10 Essential (primary) hypertension: Secondary | ICD-10-CM

## 2019-10-12 MED ORDER — LISINOPRIL 20 MG PO TABS: 20.0000 mg | ORAL_TABLET | Freq: Every day | ORAL | 0 refills | Status: DC

## 2019-10-12 MED ORDER — HYDROCHLOROTHIAZIDE 25 MG PO TABS: 25.0000 mg | ORAL_TABLET | Freq: Every day | ORAL | 0 refills | Status: DC

## 2019-11-19 ENCOUNTER — Ambulatory Visit
Admission: EM | Admit: 2019-11-19 | Discharge: 2019-11-19 | Disposition: A | Discharge status: Home / Self Care | Payer: Managed Care, Other (non HMO) | Attending: Family Medicine | Admitting: Family Medicine

## 2019-11-19 ENCOUNTER — Encounter: Payer: Self-pay | Admitting: Emergency Medicine

## 2019-11-19 ENCOUNTER — Other Ambulatory Visit: Payer: Self-pay

## 2019-11-19 DIAGNOSIS — Z7189 Other specified counseling: Secondary | ICD-10-CM | POA: Diagnosis present

## 2019-11-19 DIAGNOSIS — Z20822 Contact with and (suspected) exposure to covid-19: Secondary | ICD-10-CM

## 2019-11-19 NOTE — ED Triage Notes (Signed)
Patient here for COVID testing. He states he was exposed over a week ago. Denies symptoms. Job recommending he be tested.

## 2019-11-19 NOTE — ED Provider Notes (Signed)
[place], [place]   Name: Elijah Wells DOB: [birth year] MRN: [id number] CSN: [id number] PCP: Reubin Milan, MD  Arrival date and time:  11/19/19 8676  Chief Complaint:  COVID testing   NOTE: Prior to seeing the patient today, I have reviewed the triage nursing documentation and vital signs. Clinical staff has updated patient's PMH/PSHx, current medication list, and drug allergies/intolerances to ensure comprehensive history available to assist in medical decision making.   History:   HPI: Elijah Wells is a 29 y.o. male who presents today with complaints of recent direct exposure to SARS-CoV-2 (novel coronavirus). Known exposure reported to have occurred over the course of the last week. 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 Patient presents today with no symptoms; no cough, fevers, or other symptoms commonly associated with SARS-CoV-2. He advises that he feels generally well. Patient presents for testing out of concern for his personal health. He adds that he is being required to provide documentation of negative test results before he will be allowed to return to work.   Past Medical History:  Diagnosis Date  . Diabetes mellitus without complication (HCC)   . Hypertension   . Morbid obesity (HCC)   . Synovitis of ankle 09/20/2017   Seen by Dr. Orland Jarred    Past Surgical History:  Procedure Laterality Date  . none      Family History  Problem Relation Age of Onset  . Healthy Mother   . Healthy Father   . Breast cancer Maternal Grandmother     Social History   Tobacco Use  . Smoking status: Never Smoker  . Smokeless tobacco: Never Used  Substance Use Topics  . Alcohol use: No  . Drug use: No    Patient Active Problem List   Diagnosis Date Noted  . 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 Essential hypertension 09/20/2017  . Gastroesophageal reflux disease 09/20/2017  . BMI  50.0-59.9, adult (HCC) 09/20/2017    Home Medications:    Current Meds  Medication Sig  . hydrochlorothiazide (HYDRODIURIL) 25 MG tablet TAKE 1 TABLET DAILY  . lisinopril (ZESTRIL) 20 MG tablet TAKE 1 TABLET DAILY  . omeprazole (PRILOSEC) 20 MG capsule Take 20 mg by mouth daily as needed.  . sitaGLIPtin-metformin (JANUMET) 50-1000 MG tablet Take 1 tablet by mouth 2 (two) times daily with a meal.    Allergies:   Patient has no known allergies.  Review of Systems (ROS): Review of Systems  Constitutional: Negative for fatigue and fever.  HENT: Negative for congestion, ear pain, postnasal drip, rhinorrhea, sinus pressure, sinus pain, sneezing and sore throat.   Eyes: Negative for pain, discharge and redness.  Respiratory: Negative for cough, chest tightness and shortness of breath.   Cardiovascular: Negative for chest pain and palpitations.  Gastrointestinal: Negative for abdominal pain, diarrhea, nausea and vomiting.  Musculoskeletal: Negative for arthralgias, back pain, myalgias and neck pain.  Skin: Negative for color change, pallor and rash.  Neurological: Negative for dizziness, syncope, weakness and headaches.  Hematological: Negative for adenopathy.   Vital Signs: Today's Vitals   11/19/19 0944 11/19/19 0946 11/19/19 1005  BP:  129/74   Pulse:  78   Resp:  18   Temp:  98.4 F (36.9 C)   TempSrc:  Oral   SpO2:  97%   Weight: (!) 370 lb (167.8 kg)    Height: 5\' 5"  (1.651 m)  PainSc: 0-No pain  0-No pain    Physical Exam: Physical Exam  Constitutional: Oriented to person, place, and time and well-developed, well-nourished, and in no distress.  HENT:  Head: Normocephalic and atraumatic.  Eyes: Pupils are equal, round, and reactive to light.  Cardiovascular: Normal rate, regular rhythm, normal heart sounds and intact distal pulses.  Pulmonary/Chest: Effort normal and breath sounds normal.  Neurological: She is alert and oriented to person, place, and time. Gait  normal.  Skin: Skin is warm and dry. No rash noted. Not diaphoretic.  Psychiatric: Mood, memory, affect and judgment normal.  Nursing note and vitals reviewed.  Urgent Care Treatments / Results:   Orders Placed This Encounter  Procedures  . Novel Coronavirus, NAA (Hosp order, Send-out to Ref Lab; TAT 18-24 hrs    LABS: PLEASE NOTE: all labs that were ordered this encounter are listed, however only abnormal results are displayed. Labs Reviewed  NOVEL CORONAVIRUS, NAA (HOSP ORDER, SEND-OUT TO REF LAB; TAT 18-24 HRS)    EKG: -None  RADIOLOGY: No results found.  PROCEDURES: Procedures  MEDICATIONS RECEIVED THIS VISIT: Medications - No data to display  PERTINENT CLINICAL COURSE NOTES/UPDATES:   Initial Impression / Assessment and Plan / Urgent Care Course:  Pertinent labs & imaging results that were available during my care of the patient were personally reviewed by me and considered in my medical decision making (see lab/imaging section of note for values and interpretations).  Elijah Wells is a 29 y.o. male who presents to [facility] today with requests for COVID testing  Patient overall well appearing and in no acute distress today in clinic. Presenting symptoms (see HPI) and exam as documented above. He presents following a direct exposure to SARS-CoV-2 (novel coronavirus) following exposure. Discussed typical symptom constellation and patient reaffirms that he has none of the commonly associated symptoms seen with SARS-CoV-2 infection. Reviewed potential for infection with recent close contact. Given exposure and potential for infection, testing is reasonable. SARS-CoV-2 swab collected by certified clinical staff. Discussed variable turn around times associated with testing, as swabs are being processed at [facility], and have been between 24-48 hours to come back. He was advised to self quarantine, per [facility] DHHS guidelines, until negative results received.  Patient has  multiple co-morbidities that increase his SARS-CoV-2 morbidity and mortality risk including, T2DM, HTN, and Body mass index is 61.57 kg/m. If patient found to be positive for SARS-CoV-2, his co-morbidities should qualify him for treatment with the monoclonal antibody (bamlanivimab) infusion. Outpatient COVID response team to determine eligibility and contact the patient to discuss if she is deemed to be eligible for the infusion treatment.  Current clinical condition warrants patient being out of work in order to quarantine while waiting for testing results. He was provided with the appropriate documentation to provide to hisplace of employment that will allow for him to RTW on 11/21/2019 with no restrictions. RTW is contingent on his SARS-CoV-2 test results being reviewed as negative.    Discussed follow up with primary care physician should he develop any concerning symptoms. I have reviewed the follow up and strict return precautions for any new or worsening symptoms. Patient is aware of symptoms that would be deemed urgent/emergent, and would thus require further evaluation either here or in the emergency department. At the time of discharge, he verbalized understanding and consent with the discharge plan as it was reviewed with him. All questions were fielded by provider and/or clinic staff prior to patient discharge.  Final Clinical Impressions / Urgent Care Diagnoses:   Final diagnoses:  Exposure to COVID-19 virus  Encounter for laboratory testing for COVID-19 virus  Advice given about COVID-19 virus infection    New Prescriptions:  [place] Controlled Substance Registry consulted? Not Applicable  No orders of the defined types were placed in this encounter.   Recommended Follow up Care:  Patient encouraged to follow up with the following provider within the specified time frame, or sooner as dictated by the severity of his symptoms. As always, he was instructed that for any urgent/emergent  care needs, he should seek care either here or in the emergency department for more immediate evaluation.  Follow-up Information    Glean Hess, MD.   Specialty: Internal Medicine Why: As needed Contact information: [address] [place] [address] [phone]         NOTE: This note was prepared using Dragon dictation software along with smaller phrase technology. Despite my best ability to proofread, there is the potential that transcriptional errors may still occur from this process, and are completely unintentional.    Karen Kitchens, NP 11/19/19 1052

## 2019-11-19 NOTE — Discharge Instructions (Signed)
It was very nice seeing you today in clinic. Thank you for entrusting me with your care.   You were tested for SARS-CoV-2 (novel coronavirus) today. Testing is performed by an outside lab ([facility]) and has variable turn around times ranging between 24-48 hours. Current recommendations from the the CDC and [place] DHHS require that you remain out of work in order to quarantine at home until negative test results are have been received. In the event that your test results are positive, you will be contacted with further directives. These measures are being implemented out of an abundance of caution to prevent transmission and spread during the current SARS-CoV-2 pandemic.  If you develop any worsening symptoms or concerns, make arrangements to follow up with your regular doctor. If your symptoms are severe, please seek follow up care in the ER. Please remember, our [facility] providers are "right here with you" when you need us.   Again, it was my pleasure to take care of you today. Thank you for choosing our clinic. I hope that you start to feel better quickly.   Alexandrya Chim, MSN, APRN, FNP-C, CEN Advanced Practice Provider [facility] [place] [facility] 

## 2019-11-20 LAB — NOVEL CORONAVIRUS, NAA (HOSP ORDER, SEND-OUT TO REF LAB; TAT 18-24 HRS): SARS-CoV-2, NAA: NOT DETECTED

## 2019-12-01 ENCOUNTER — Other Ambulatory Visit: Payer: Self-pay

## 2019-12-01 ENCOUNTER — Encounter: Payer: Self-pay | Admitting: Internal Medicine

## 2019-12-01 ENCOUNTER — Ambulatory Visit: Payer: Managed Care, Other (non HMO) | Admitting: Internal Medicine

## 2019-12-01 VITALS — BP 118/80 | HR 94 | Temp 98.5°F | Ht 65.0 in | Wt 380.0 lb

## 2019-12-01 DIAGNOSIS — I10 Essential (primary) hypertension: Secondary | ICD-10-CM | POA: Diagnosis not present

## 2019-12-01 DIAGNOSIS — N181 Chronic kidney disease, stage 1: Secondary | ICD-10-CM

## 2019-12-01 DIAGNOSIS — E1122 Type 2 diabetes mellitus with diabetic chronic kidney disease: Secondary | ICD-10-CM | POA: Diagnosis not present

## 2019-12-01 DIAGNOSIS — I129 Hypertensive chronic kidney disease with stage 1 through stage 4 chronic kidney disease, or unspecified chronic kidney disease: Secondary | ICD-10-CM | POA: Diagnosis not present

## 2019-12-01 MED ORDER — JANUMET 50-1000 MG PO TABS: 1.0000 | ORAL_TABLET | Freq: Two times a day (BID) | ORAL | 1 refills | Status: DC

## 2019-12-01 MED ORDER — FARXIGA 10 MG PO TABS: 10.0000 mg | ORAL_TABLET | Freq: Every day | ORAL | 0 refills | Status: DC

## 2019-12-01 NOTE — Progress Notes (Signed)
Date:  12/01/2019   Name:  Elijah Wells   DOB:  10-06-1991   MRN:  [id number]   Chief Complaint: Hypertension (Follow up.) and Diabetes  Diabetes He presents for his follow-up diabetic visit. He has type 2 diabetes mellitus. Pertinent negatives for hypoglycemia include no headaches or tremors. Pertinent negatives for diabetes include no chest pain, no fatigue, no polydipsia and no polyuria. Current diabetic treatments: Janumet and Rybelsus added last visit but pt is not taking it. He is compliant with treatment all of the time. An ACE inhibitor/angiotensin II receptor blocker is being taken.  Hypertension The problem is controlled. Pertinent negatives include no chest pain, headaches, palpitations or shortness of breath. Past treatments include ACE inhibitors. The current treatment provides significant improvement.    Lab Results  Component Value Date   CREATININE 0.99 07/24/2019   BUN 8 07/24/2019   NA 137 07/24/2019   K 4.1 07/24/2019   CL 99 07/24/2019   CO2 25 07/24/2019   Lab Results  Component Value Date   CHOL 116 07/24/2019   HDL 35 (L) 07/24/2019   LDLCALC 64 07/24/2019   TRIG 87 07/24/2019   CHOLHDL 3.3 07/24/2019   Lab Results  Component Value Date   TSH 1.744 05/27/2018   Lab Results  Component Value Date   HGBA1C 9.8 (H) 07/24/2019     Review of Systems  Constitutional: Positive for unexpected weight change (he has lost 10 lbs). Negative for appetite change and fatigue.  Eyes: Negative for visual disturbance.  Respiratory: Negative for cough, shortness of breath and wheezing.   Cardiovascular: Negative for chest pain, palpitations and leg swelling.  Gastrointestinal: Negative for abdominal pain and blood in stool.  Endocrine: Negative for polydipsia and polyuria.  Genitourinary: Negative for dysuria and hematuria.  Skin: Negative for color change and rash.  Neurological: Negative for tremors, numbness and headaches.  Psychiatric/Behavioral:  Negative for dysphoric mood.    Patient Active Problem List   Diagnosis Date Noted  . 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 Essential hypertension 09/20/2017  . Gastroesophageal reflux disease 09/20/2017  . BMI 50.0-59.9, adult (HCC) 09/20/2017    No Known Allergies  Past Surgical History:  Procedure Laterality Date  . none      Social History   Tobacco Use  . Smoking status: Never Smoker  . Smokeless tobacco: Never Used  Substance Use Topics  . Alcohol use: No  . Drug use: No     Medication list has been reviewed and updated.  Current Meds  Medication Sig  . hydrochlorothiazide (HYDRODIURIL) 25 MG tablet TAKE 1 TABLET DAILY (Patient taking differently: Take 25 mg by mouth daily. )  . lisinopril (ZESTRIL) 20 MG tablet TAKE 1 TABLET DAILY (Patient taking differently: Take 20 mg by mouth daily. )  . omeprazole (PRILOSEC) 20 MG capsule Take 20 mg by mouth daily as needed.   . sitaGLIPtin-metformin (JANUMET) 50-1000 MG tablet Take 1 tablet by mouth 2 (two) times daily with a meal. (Patient taking differently: Take 1 tablet by mouth 2 (two) times daily with a meal. )    PHQ 2/9 Scores 12/01/2019 07/24/2019 09/20/2017  PHQ - 2 Score 0 0 0  PHQ- 9 Score 0 - -    BP Readings from Last 3 Encounters:  12/01/19 118/80  11/19/19 129/74  07/24/19 114/74    Physical Exam Vitals and nursing note reviewed.  Constitutional:      General: He is  not in acute distress.    Appearance: He is well-developed. He is obese.  HENT:     Head: Normocephalic and atraumatic.  Cardiovascular:     Rate and Rhythm: Normal rate and regular rhythm.     Pulses: Normal pulses.     Heart sounds: No murmur.  Pulmonary:     Effort: Pulmonary effort is normal. No respiratory distress.     Breath sounds: No wheezing or rhonchi.  Musculoskeletal:     Cervical back: Normal range of motion.     Right lower leg: No edema.     Left lower leg: No edema.  Lymphadenopathy:      Cervical: No cervical adenopathy.  Skin:    General: Skin is warm and dry.     Findings: No rash.  Neurological:     Mental Status: He is alert and oriented to person, place, and time.  Psychiatric:        Behavior: Behavior normal.        Thought Content: Thought content normal.     Wt Readings from Last 3 Encounters:  12/01/19 (!) 380 lb (172.4 kg)  11/19/19 (!) 370 lb (167.8 kg)  07/24/19 (!) 390 lb (176.9 kg)    BP 118/80   Pulse 94   Temp 98.5 F (36.9 C) (Oral)   Ht 5\' 5"  (1.651 m)   Wt (!) 380 lb (172.4 kg)   SpO2 96%   BMI 63.24 kg/m   Assessment and Plan: 1. 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  Dispense: 90 tablet; Refill: 0 - sitaGLIPtin-metformin (JANUMET) 50-1000 MG tablet; Take 1 tablet by mouth 2 (two) times daily with a meal.  Dispense: 180 tablet; Refill: 1  2. Essential hypertension Clinically stable exam with well controlled BP on lisinopril. Tolerating medications without side effects at this time. Pt to continue current regimen and low sodium diet; benefits of regular exercise as able discussed. - Basic metabolic panel   Partially dictated using Editor, commissioning. Any errors are unintentional.  Halina Maidens, MD [facility]  12/01/2019

## 2019-12-02 LAB — BASIC METABOLIC PANEL
BUN/Creatinine Ratio: 9 (ref 9–20)
BUN: 9 mg/dL (ref 6–20)
CO2: 25 mmol/L (ref 20–29)
Calcium: 10.3 mg/dL — ABNORMAL HIGH (ref 8.7–10.2)
Chloride: 95 mmol/L — ABNORMAL LOW (ref 96–106)
Creatinine, Ser: 1.03 mg/dL (ref 0.76–1.27)
GFR calc Af Amer: 113 mL/min/{1.73_m2} (ref >59)
GFR calc non Af Amer: 98 mL/min/{1.73_m2} (ref >59)
Glucose: 321 mg/dL — ABNORMAL HIGH (ref 65–99)
Potassium: 4.5 mmol/L (ref 3.5–5.2)
Sodium: 136 mmol/L (ref 134–144)

## 2019-12-02 LAB — HEMOGLOBIN A1C
Est. average glucose Bld gHb Est-mCnc: 292 mg/dL
Hgb A1c MFr Bld: 11.8 % — ABNORMAL HIGH (ref 4.8–5.6)

## 2019-12-04 ENCOUNTER — Other Ambulatory Visit: Payer: Self-pay | Admitting: Internal Medicine

## 2019-12-04 ENCOUNTER — Encounter: Payer: Self-pay | Admitting: Internal Medicine

## 2019-12-04 MED ORDER — JANUMET 50-1000 MG PO TABS: 1.0000 | ORAL_TABLET | Freq: Two times a day (BID) | ORAL | 0 refills | Status: DC

## 2019-12-09 ENCOUNTER — Encounter: Payer: Self-pay | Admitting: Internal Medicine

## 2019-12-15 ENCOUNTER — Other Ambulatory Visit: Payer: Self-pay | Admitting: Internal Medicine

## 2019-12-25 ENCOUNTER — Encounter: Payer: Self-pay | Admitting: Internal Medicine

## 2019-12-26 ENCOUNTER — Other Ambulatory Visit: Payer: Self-pay | Admitting: Internal Medicine

## 2019-12-26 DIAGNOSIS — E1122 Type 2 diabetes mellitus with diabetic chronic kidney disease: Secondary | ICD-10-CM

## 2019-12-26 MED ORDER — FARXIGA 10 MG PO TABS: 10.0000 mg | ORAL_TABLET | Freq: Every day | ORAL | 5 refills | Status: DC

## 2020-01-05 ENCOUNTER — Encounter: Payer: Self-pay | Admitting: Internal Medicine

## 2020-01-11 ENCOUNTER — Encounter: Payer: Self-pay | Admitting: Internal Medicine

## 2020-01-11 ENCOUNTER — Telehealth: Payer: Self-pay

## 2020-01-11 NOTE — Telephone Encounter (Signed)
Completed PA on Farxiga 10 mg tabs on [email or web address].  "Your information has been submitted to [employer]. To check for an updated outcome later, reopen this PA request from your dashboard.  If [employer] has not responded to your request within 24 hours, contact [employer] at [phone].   (Key: [id number]) Farxiga 10MG  tablets

## 2020-01-12 NOTE — Telephone Encounter (Signed)
PA APPROVED. Pt informed through my chart.   CM

## 2020-03-31 ENCOUNTER — Encounter: Payer: Self-pay | Admitting: Internal Medicine

## 2020-03-31 ENCOUNTER — Other Ambulatory Visit: Payer: Self-pay

## 2020-03-31 ENCOUNTER — Ambulatory Visit (INDEPENDENT_AMBULATORY_CARE_PROVIDER_SITE_OTHER): Payer: 59 | Admitting: Internal Medicine

## 2020-03-31 VITALS — BP 126/82 | HR 91 | Temp 98.1°F | Ht 65.0 in | Wt 385.0 lb

## 2020-03-31 DIAGNOSIS — E1122 Type 2 diabetes mellitus with diabetic chronic kidney disease: Secondary | ICD-10-CM | POA: Diagnosis not present

## 2020-03-31 DIAGNOSIS — I10 Essential (primary) hypertension: Secondary | ICD-10-CM | POA: Diagnosis not present

## 2020-03-31 DIAGNOSIS — Z6841 Body Mass Index (BMI) 40.0 and over, adult: Secondary | ICD-10-CM

## 2020-03-31 DIAGNOSIS — N181 Chronic kidney disease, stage 1: Secondary | ICD-10-CM

## 2020-03-31 DIAGNOSIS — I129 Hypertensive chronic kidney disease with stage 1 through stage 4 chronic kidney disease, or unspecified chronic kidney disease: Secondary | ICD-10-CM

## 2020-03-31 MED ORDER — JANUMET 50-1000 MG PO TABS: 1.0000 | ORAL_TABLET | Freq: Two times a day (BID) | ORAL | 1 refills | Status: DC

## 2020-03-31 NOTE — Progress Notes (Signed)
Date:  03/31/2020   Name:  Elijah Wells   DOB:  [birth year]   MRN:  [id number]   Chief Complaint: Diabetes (follow up last reading 150's X1 week ago ) and Hypertension  Diabetes He presents for his follow-up diabetic visit. He has type 2 diabetes mellitus. His disease course has been improving. Pertinent negatives for hypoglycemia include no dizziness, headaches or nervousness/anxiousness. Pertinent negatives for diabetes include no chest pain, no fatigue, no foot paresthesias, no polyphagia, no polyuria and no weight loss. Symptoms are improving. Current diabetic treatment includes oral agent (triple therapy) (farxiga added last visit). He is compliant with treatment all of the time. His weight is increasing steadily. He is following a generally healthy diet. He has not had a previous visit with a dietitian. His home blood glucose trend is decreasing steadily. An ACE inhibitor/angiotensin II receptor blocker is being taken.  Hypertension This is a chronic problem. The problem is controlled. Pertinent negatives include no chest pain, headaches or shortness of breath. Past treatments include ACE inhibitors. The current treatment provides significant improvement.    Lab Results  Component Value Date   CREATININE 1.03 12/01/2019   BUN 9 12/01/2019   NA 136 12/01/2019   K 4.5 12/01/2019   CL 95 (L) 12/01/2019   CO2 25 12/01/2019   Lab Results  Component Value Date   CHOL 116 07/24/2019   HDL 35 (L) 07/24/2019   LDLCALC 64 07/24/2019   TRIG 87 07/24/2019   CHOLHDL 3.3 07/24/2019   Lab Results  Component Value Date   TSH 1.744 05/27/2018   Lab Results  Component Value Date   HGBA1C 11.8 (H) 12/01/2019   Lab Results  Component Value Date   WBC 6.7 07/24/2019   HGB 14.0 07/24/2019   HCT 42.7 07/24/2019   MCV 82 07/24/2019   PLT 323 07/24/2019   Lab Results  Component Value Date   ALT 104 (H) 07/24/2019   AST 93 (H) 07/24/2019   ALKPHOS 80 07/24/2019   BILITOT 0.5  07/24/2019     Review of Systems  Constitutional: Negative for chills, fatigue, fever, unexpected weight change and weight loss.  HENT: Negative for trouble swallowing.   Eyes: Negative for visual disturbance.  Respiratory: Negative for cough, chest tightness and shortness of breath.   Cardiovascular: Negative for chest pain and leg swelling.  Gastrointestinal: Negative for abdominal pain.  Endocrine: Negative for polyphagia and polyuria.  Musculoskeletal: Negative for arthralgias.  Neurological: Negative for dizziness and headaches.  Psychiatric/Behavioral: Negative for dysphoric mood. The patient is not nervous/anxious.     Patient Active Problem List   Diagnosis Date Noted  . 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 Essential hypertension 09/20/2017  . Gastroesophageal reflux disease 09/20/2017  . BMI 50.0-59.9, adult ([place]) 09/20/2017    No Known Allergies  Past Surgical History:  Procedure Laterality Date  . none      Social History   Tobacco Use  . Smoking status: Never Smoker  . Smokeless tobacco: Never Used  Vaping Use  . Vaping Use: Never used  Substance Use Topics  . Alcohol use: No  . Drug use: No     Medication list has been reviewed and updated.  Current Meds  Medication Sig  . dapagliflozin propanediol (FARXIGA) 10 MG TABS tablet Take 10 mg by mouth daily before breakfast.  . hydrochlorothiazide (HYDRODIURIL) 25 MG tablet TAKE 1 TABLET DAILY  . lisinopril (ZESTRIL) 20 MG tablet  TAKE 1 TABLET DAILY  . omeprazole (PRILOSEC) 20 MG capsule Take 20 mg by mouth daily as needed.   . sitaGLIPtin-metformin (JANUMET) 50-1000 MG tablet Take 1 tablet by mouth 2 (two) times daily with a meal.    PHQ 2/9 Scores 03/31/2020 12/01/2019 07/24/2019 09/20/2017  PHQ - 2 Score 0 0 0 0  PHQ- 9 Score 0 0 - -    GAD 7 : Generalized Anxiety Score 03/31/2020 12/01/2019  Nervous, Anxious, on Edge 0 0  Control/stop worrying 0 0  Worry too much -  different things 0 0  Trouble relaxing 0 0  Restless 0 0  Easily annoyed or irritable 0 0  Afraid - awful might happen 0 0  Total GAD 7 Score 0 0  Anxiety Difficulty Not difficult at all Not difficult at all    BP Readings from Last 3 Encounters:  03/31/20 126/82  12/01/19 118/80  11/19/19 129/74    Physical Exam Vitals and nursing note reviewed.  Constitutional:      General: He is not in acute distress.    Appearance: He is well-developed.  HENT:     Head: Normocephalic and atraumatic.  Cardiovascular:     Rate and Rhythm: Normal rate and regular rhythm.     Pulses: Normal pulses.     Heart sounds: No murmur heard.   Pulmonary:     Effort: Pulmonary effort is normal. No respiratory distress.     Breath sounds: No wheezing or rhonchi.  Musculoskeletal:     Cervical back: Normal range of motion.     Right lower leg: No edema.     Left lower leg: No edema.  Lymphadenopathy:     Cervical: No cervical adenopathy.  Skin:    General: Skin is warm and dry.     Findings: No rash.  Neurological:     General: No focal deficit present.     Mental Status: He is alert and oriented to person, place, and time.  Psychiatric:        Mood and Affect: Mood normal.     Wt Readings from Last 3 Encounters:  03/31/20 (!) 385 lb (174.6 kg)  12/01/19 (!) 380 lb (172.4 kg)  11/19/19 (!) 370 lb (167.8 kg)    BP 126/82   Pulse 91   Temp 98.1 F (36.7 C) (Oral)   Ht 5\' 5"  (1.651 m)   Wt (!) 385 lb (174.6 kg)   SpO2 97%   BMI 64.07 kg/m   Assessment and Plan: 1. 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 DM complicated by htn. Tolerating medications well without side effects or other concerns.  Farxiga added last visit - no side effects noted, staying hydrated with water. - sitaGLIPtin-metformin (JANUMET) 50-1000 MG tablet; Take 1 tablet by mouth 2 (two) times daily with a meal.  Dispense: 180 tablet;  Refill: 1 - Hemoglobin A1c - Basic metabolic panel - Amb Ref to Medical Weight Management  2. Essential hypertension Clinically stable exam with well controlled BP on lisinopril. Tolerating medications without side effects at this time. Pt to continue current regimen and low sodium diet; benefits of regular exercise as able discussed.  3. BMI 50.0-59.9, adult (HCC) - Amb Ref to Medical Weight Management   Partially dictated using 02-10-1995. Any errors are unintentional.  Animal nutritionist, MD [facility]  03/31/2020

## 2020-04-01 LAB — BASIC METABOLIC PANEL
BUN/Creatinine Ratio: 12 (ref 9–20)
BUN: 12 mg/dL (ref 6–20)
CO2: 25 mmol/L (ref 20–29)
Calcium: 10.2 mg/dL (ref 8.7–10.2)
Chloride: 98 mmol/L (ref 96–106)
Creatinine, Ser: 0.97 mg/dL (ref 0.76–1.27)
GFR calc Af Amer: 121 mL/min/{1.73_m2} (ref >59)
GFR calc non Af Amer: 105 mL/min/{1.73_m2} (ref >59)
Glucose: 126 mg/dL — ABNORMAL HIGH (ref 65–99)
Potassium: 4.1 mmol/L (ref 3.5–5.2)
Sodium: 136 mmol/L (ref 134–144)

## 2020-04-01 LAB — HEMOGLOBIN A1C
Est. average glucose Bld gHb Est-mCnc: 209 mg/dL
Hgb A1c MFr Bld: 8.9 % — ABNORMAL HIGH (ref 4.8–5.6)

## 2020-04-14 ENCOUNTER — Other Ambulatory Visit: Payer: Self-pay

## 2020-04-14 ENCOUNTER — Ambulatory Visit (INDEPENDENT_AMBULATORY_CARE_PROVIDER_SITE_OTHER): Payer: 59 | Admitting: Family Medicine

## 2020-04-14 ENCOUNTER — Encounter (INDEPENDENT_AMBULATORY_CARE_PROVIDER_SITE_OTHER): Payer: Self-pay | Admitting: Family Medicine

## 2020-04-14 VITALS — BP 127/77 | HR 69 | Temp 98.3°F | Ht 65.0 in | Wt 381.0 lb

## 2020-04-14 DIAGNOSIS — Z9189 Other specified personal risk factors, not elsewhere classified: Secondary | ICD-10-CM

## 2020-04-14 DIAGNOSIS — G4733 Obstructive sleep apnea (adult) (pediatric): Secondary | ICD-10-CM

## 2020-04-14 DIAGNOSIS — I10 Essential (primary) hypertension: Secondary | ICD-10-CM

## 2020-04-14 DIAGNOSIS — E119 Type 2 diabetes mellitus without complications: Secondary | ICD-10-CM

## 2020-04-14 DIAGNOSIS — F3289 Other specified depressive episodes: Secondary | ICD-10-CM | POA: Diagnosis not present

## 2020-04-14 DIAGNOSIS — E1159 Type 2 diabetes mellitus with other circulatory complications: Secondary | ICD-10-CM | POA: Diagnosis not present

## 2020-04-14 DIAGNOSIS — R5383 Other fatigue: Secondary | ICD-10-CM

## 2020-04-14 DIAGNOSIS — R0602 Shortness of breath: Secondary | ICD-10-CM | POA: Diagnosis not present

## 2020-04-14 DIAGNOSIS — Z0289 Encounter for other administrative examinations: Secondary | ICD-10-CM

## 2020-04-14 DIAGNOSIS — Z6841 Body Mass Index (BMI) 40.0 and over, adult: Secondary | ICD-10-CM

## 2020-04-14 NOTE — Progress Notes (Signed)
Dear Dr. Judithann Graves,   Thank you for referring Elijah Wells to our clinic. The following note includes my evaluation and treatment recommendations.  Chief Complaint:   OBESITY Elijah Wells (MR# [id number]) is a 29 y.o. male who presents for evaluation and treatment of obesity and related comorbidities. Current BMI is Body mass index is 63.4 kg/m. Elijah Wells has been struggling with his weight for many years and has been unsuccessful in either losing weight, maintaining weight loss, or reaching his healthy weight goal.  Elijah Wells is currently in the action stage of change and ready to dedicate time achieving and maintaining a healthier weight. Elijah Wells is interested in becoming our patient and working on intensive lifestyle modifications including (but not limited to) diet and exercise for weight loss.  Elijah Wells lives with his mom, age 41. "I over snack."  He says he loves breakfast.  He snacks on chips, sweet tea, and candy.  He eats out about 10 times per week, fast foods.  He says he eats fried chicken, fries, onion rings.  He has 2 jobs and lives a busy lifestyle.  He wants to be better and get healthier.  Elijah Wells's habits were reviewed today and are as follows: he has been heavy most of his life, he started gaining weight after undergrad college, his heaviest weight ever was 397 pounds, he is a picky eater and doesn't like to eat healthier foods, he craves chicken nuggets, fries, onion rings, breakfast, candy, soup, he snacks frequently in the evenings, he wakes up frequently in the middle of the night to eat, he frequently makes poor food choices and he struggles with emotional eating.  Depression Screen Elijah Wells's Food and Mood (modified PHQ-9) score was 6.  Depression screen PHQ 2/9 04/14/2020  Decreased Interest 1  Down, Depressed, Hopeless 0  PHQ - 2 Score 1  Altered sleeping 1  Tired, decreased energy 1  Change in appetite 1  Feeling bad or failure about yourself  0  Trouble concentrating 1    Moving slowly or fidgety/restless 1  Suicidal thoughts 0  PHQ-9 Score 6  Difficult doing work/chores Not difficult at all   Subjective:   1. Other fatigue Amron denies daytime somnolence and admits to waking up still tired. Patent has a history of symptoms of morning fatigue and snoring. Elijah Wells generally gets 6-8 hours of sleep per night, and states that he has generally restful sleep. Snoring is present. Apneic episodes are not present. Epworth Sleepiness Score is 1.  2. Shortness of breath on exertion Elijah Wells notes increasing shortness of breath with exercising and seems to be worsening over time with weight gain. He notes getting out of breath sooner with activity than he used to. This has not gotten worse recently. Elijah Wells denies shortness of breath at rest or orthopnea.  3. Type 2 diabetes mellitus without complication, without long-term current use of insulin (HCC) Medications reviewed. Diabetic ROS: no polyuria or polydipsia, no chest pain, dyspnea or TIA's, no numbness, tingling or pain in extremities.  He was diagnosed in 2015-2016, type II.  Management per PCP.  Fasting blood sugar this morning was 118.  Lab Results  Component Value Date   HGBA1C 8.9 (H) 03/31/2020   HGBA1C 11.8 (H) 12/01/2019   HGBA1C 9.8 (H) 07/24/2019   Lab Results  Component Value Date   LDLCALC 64 07/24/2019   CREATININE 0.97 03/31/2020   4. Hypertension associated with diabetes (HCC) Review: taking medications as instructed, no medication side effects noted, no  chest pain on exertion, no dyspnea on exertion, no swelling of ankles.   BP Readings from Last 3 Encounters:  04/14/20 127/77  03/31/20 126/82  12/01/19 118/80   5. OSA (obstructive sleep apnea) Elijah Wells has a diagnosis of sleep apnea. He reports that he is using a CPAP regularly.   6. Other depression, with emotional eating Elijah Wells is struggling with emotional eating and using food for comfort to the extent that it is negatively impacting his  health. He has been working on behavior modification techniques to help reduce his emotional eating and has been unsuccessful. He shows no sign of suicidal or homicidal ideations.  7. At risk for hypoglycemia Elijah Wells is at risk for hypoglycemia as he undergoes weight loss.  Assessment/Plan:   1. Other fatigue Elijah Wells does feel that his weight is causing his energy to be lower than it should be. Fatigue may be related to obesity, depression or many other causes. Labs will be ordered, and in the meanwhile, Elijah Wells will focus on self care including making healthy food choices, increasing physical activity and focusing on stress reduction. - EKG 12-Lead - CBC with Differential/Platelet - Lipid panel - VITAMIN D 25 Hydroxy (Vit-D Deficiency, Fractures) - Vitamin B12 - TSH - T3, free - T4, free - Hepatic function panel  2. Shortness of breath on exertion Elijah Wells does not feel that he gets out of breath more easily that he used to when he exercises. Elijah Wells's shortness of breath appears to be obesity related and exercise induced. He has agreed to work on weight loss and gradually increase exercise to treat his exercise induced shortness of breath. Will continue to monitor closely.  3. Type 2 diabetes mellitus without complication, without long-term current use of insulin (HCC) Good blood sugar control is important to decrease the likelihood of diabetic complications such as nephropathy, neuropathy, limb loss, blindness, coronary artery disease, and death. Intensive lifestyle modification including diet, exercise and weight loss are the first line of treatment for diabetes.  - Insulin, random  4. Hypertension associated with diabetes (HCC) Elijah Wells is working on healthy weight loss and exercise to improve blood pressure control. We will watch for signs of hypotension as he continues his lifestyle modifications.  5. OSA (obstructive sleep apnea) Intensive lifestyle modifications are the first line treatment  for this issue. We discussed several lifestyle modifications today and he will continue to work on diet, exercise and weight loss efforts. We will continue to monitor. Orders and follow up as documented in patient record.   Counseling  Sleep apnea is a condition in which breathing pauses or becomes shallow during sleep. This happens over and over during the night. This disrupts your sleep and keeps your body from getting the rest that it needs, which can cause tiredness and lack of energy (fatigue) during the day.  Sleep apnea treatment: If you were given a device to open your airway while you sleep, USE IT!  Sleep hygiene:   Limit or avoid alcohol, caffeinated beverages, and cigarettes, especially close to bedtime.   Do not eat a large meal or eat spicy foods right before bedtime. This can lead to digestive discomfort that can make it hard for you to sleep.  Keep a sleep diary to help you and your health care provider figure out what could be causing your insomnia.  . Make your bedroom a dark, comfortable place where it is easy to fall asleep. ? Put up shades or blackout curtains to block light from outside. ? Use  a white noise machine to block noise. ? Keep the temperature cool. . Limit screen use before bedtime. This includes: ? Watching TV. ? Using your smartphone, tablet, or computer. . Stick to a routine that includes going to bed and waking up at the same times every day and night. This can help you fall asleep faster. Consider making a quiet activity, such as reading, part of your nighttime routine. . Try to avoid taking naps during the day so that you sleep better at night. . Get out of bed if you are still awake after 15 minutes of trying to sleep. Keep the lights down, but try reading or doing a quiet activity. When you feel sleepy, go back to bed.  6. Other depression, with emotional eating Good blood sugar control is important to decrease the likelihood of diabetic  complications such as nephropathy, neuropathy, limb loss, blindness, coronary artery disease, and death. Intensive lifestyle modification including diet, exercise and weight loss are the first line of treatment for diabetes.  No SI/HI, but he does a lot of emotional eating that drives his choices.  Patient was referred to Dr. Dewaine Conger, our Bariatric Psychologist, for evaluation due to his elevated PHQ-9 score and significant struggles with emotional eating.  7. At risk for hypoglycemia Tyner was given approximately 15 minutes of counseling today regarding prevention of hypoglycemia. He was advised of symptoms of hypoglycemia. Robson was instructed to avoid skipping meals, eat regular protein rich meals and schedule low calorie snacks as needed.   Repetitive spaced learning was employed today to elicit superior memory formation and behavioral change  8. Class 3 severe obesity with serious comorbidity and body mass index (BMI) of 50.0 to 59.9 in adult, unspecified obesity type [facility] Raciel is currently in the action stage of change and his goal is to continue with weight loss efforts. I recommend Benino begin the structured treatment plan as follows:  He has agreed to the Category 4 Plan.  Exercise goals: As is.   Behavioral modification strategies: increasing lean protein intake, decreasing eating out, no skipping meals and ways to avoid night time snacking.  He was informed of the importance of frequent follow-up visits to maximize his success with intensive lifestyle modifications for his multiple health conditions. He was informed we would discuss his lab results at his next visit unless there is a critical issue that needs to be addressed sooner. Boston agreed to keep his next visit at the agreed upon time to discuss these results.  Objective:   Blood pressure 127/77, pulse 69, temperature 98.3 F (36.8 C), temperature source Oral, height 5\' 5"  (1.651 m), weight (!) 381 lb (172.8 kg), SpO2 98 %.  Body mass index is 63.4 kg/m.  EKG: Normal sinus rhythm, rate 87 bpm.  Indirect Calorimeter completed today shows a VO2 of 374 and a REE of 2604.  His calculated basal metabolic rate is 2605 thus his basal metabolic rate is worse than expected.  General: Cooperative, alert, well developed, in no acute distress. HEENT: Conjunctivae and lids unremarkable. Cardiovascular: Regular rhythm.  Lungs: Normal work of breathing. Neurologic: No focal deficits.   Lab Results  Component Value Date   CREATININE 0.97 03/31/2020   BUN 12 03/31/2020   NA 136 03/31/2020   K 4.1 03/31/2020   CL 98 03/31/2020   CO2 25 03/31/2020   Lab Results  Component Value Date   ALT 104 (H) 07/24/2019   AST 93 (H) 07/24/2019   ALKPHOS 80 07/24/2019   BILITOT  0.5 07/24/2019   Lab Results  Component Value Date   HGBA1C 8.9 (H) 03/31/2020   HGBA1C 11.8 (H) 12/01/2019   HGBA1C 9.8 (H) 07/24/2019   HGBA1C 6.5 (H) 09/26/2018   HGBA1C 6.5 (H) 05/27/2018   Lab Results  Component Value Date   TSH 1.744 05/27/2018   Lab Results  Component Value Date   CHOL 116 07/24/2019   HDL 35 (L) 07/24/2019   LDLCALC 64 07/24/2019   TRIG 87 07/24/2019   CHOLHDL 3.3 07/24/2019   Lab Results  Component Value Date   WBC 6.7 07/24/2019   HGB 14.0 07/24/2019   HCT 42.7 07/24/2019   MCV 82 07/24/2019   PLT 323 07/24/2019   Attestation Statements:   Reviewed by clinician on day of visit: allergies, medications, problem list, medical history, surgical history, family history, social history, and previous encounter notes.  I, Insurance claims handlerAmber Agner, CMA, am acting as Energy managertranscriptionist for Marsh & McLennanDeborah Brigid Vandekamp, DO.  I have reviewed the above documentation for accuracy and completeness, and I agree with the above. Thomasene Lot- Laurie Lovejoy, DO

## 2020-04-14 NOTE — Progress Notes (Signed)
Office: [phone]  /  Fax: [phone]    Date: April 28, 2020   Appointment Start Time: 10:55am Duration: 27 minutes Provider: Lawerance Cruel, Psy.D. Type of Session: Intake for Individual Therapy  Location of Patient: Work Location of Provider: Provider's Home Type of Contact: Telepsychological Visit via MyChart Video Visit  Informed Consent: Prior to proceeding with today's appointment, two pieces of identifying information were obtained. In addition, Quinterrius's physical location at the time of this appointment was obtained as well a phone number he could be reached at in the event of technical difficulties. [name] and this provider participated in today's telepsychological service.   The provider's role was explained to Jaclynn Major. The provider reviewed and discussed issues of confidentiality, privacy, and limits therein (e.g., reporting obligations). In addition to verbal informed consent, written informed consent for psychological services was obtained prior to the initial appointment. Since the clinic is not a 24/7 crisis center, mental health emergency resources were shared and this  provider explained MyChart, e-mail, voicemail, and/or other messaging systems should be utilized only for non-emergency reasons. This provider also explained that information obtained during appointments will be placed in Crimson's medical record and relevant information will be shared with other providers at [facility] for coordination of care. Moreover, Jude agreed information may be shared with other [facility] providers as needed for coordination of care. By signing the service agreement document, Jonatan provided written consent for coordination of care. Prior to initiating telepsychological services, Marquavius completed an informed consent document, which included the development of a safety plan (i.e., an emergency contact, nearest emergency room, and emergency resources) in the  event of an emergency/crisis. Brenen expressed understanding of the rationale of the safety plan. Dawn verbally acknowledged understanding he is ultimately responsible for understanding his insurance benefits for telepsychological and in-person services. This provider also reviewed confidentiality, as it relates to telepsychological services, as well as the rationale for telepsychological services (i.e., to reduce exposure risk to COVID-19). Jeray  acknowledged understanding that appointments cannot be recorded without both party consent and he is aware he is responsible for securing confidentiality on his end of the session. Emory verbally consented to proceed.  Chief Complaint/HPI: Syed was referred by Dr. Thomasene Lot due to other depression, with emotional eating. Per the note for the initial visit with Dr. Thomasene Lot on April 14, 2020, "Jeren is struggling with emotional eating and using food for comfort to the extent that it is negatively impacting his health. He has been working on behavior modification techniques to help reduce his emotional eating and has been unsuccessful. He shows no sign of suicidal or homicidal ideations." The note for the initial appointment with Dr. Thomasene Lot  indicated the following: "Frances's habits were reviewed today and are as follows: he has been heavy most of his life, he started gaining weight after undergrad college, his heaviest weight ever was 397 pounds, he is a picky eater and doesn't like to eat healthier foods, he craves chicken nuggets, fries, onion rings, breakfast, candy, soup, he snacks frequently in the evenings, he wakes up frequently in the middle of the night to eat, he frequently makes poor food choices and he struggles with emotional eating." Niam's Food and Mood (modified PHQ-9) score on April 14, 2020 was 6.  During today's appointment, Jamale was verbally administered a questionnaire assessing various behaviors related to emotional eating.  Yassin endorsed the following: overeat when you are celebrating, experience food cravings on a  regular basis, eat certain foods when you are anxious, stressed, depressed, or your feelings are hurt, use food to help you cope with emotional situations, find food is comforting to you, overeat when you are angry or upset, overeat when you are worried about something, overeat frequently when you are bored or lonely, not worry about what you eat when you are in a good mood, overeat when you are alone, but eat much less when you are with other people and eat as a reward. Joselyn Glassmanyler stated work related stressors recently resulted in emotional eating "getting out of control." Joselyn Glassmanyler believes the onset of emotional eating was likely around the end of middle school, adding he was bullied a lot. In addition, Joselyn Glassmanyler endorsed snacking frequently, but did not endorse engaging in binge eating behaviors. Joselyn Glassmanyler denied a history of restricting food intake, purging and engagement in other compensatory strategies, and has never been diagnosed with an eating disorder. He also denied a history of treatment for emotional eating. Moreover, Joselyn Glassmanyler indicated stress triggers emotional eating, whereas feeling on control makes emotional eating better. Furthermore, Joselyn Glassmanyler denied other problems of concern.    Mental Status Examination:  Appearance: well groomed and appropriate hygiene  Behavior: appropriate to circumstances Mood: euthymic Affect: mood congruent Speech: normal in rate, volume, and tone Eye Contact: appropriate Psychomotor Activity: appropriate Gait: unable to assess Thought Process: linear, logical, and goal directed  Thought Content/Perception: denies suicidal and homicidal ideation, plan, and intent and no hallucinations, delusions, bizarre thinking or behavior reported or observed Orientation: time, person, place, and purpose of appointment Memory/Concentration: memory, attention, language, and fund of knowledge intact    Insight/Judgment: good  Family & Psychosocial History: Joselyn Glassmanyler reported he is not in a relationship and he does not have any children. 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 Additionally, Joselyn Glassmanyler shared his highest level of education obtained is a bachelor's degree. Currently, Jarmarcus's social support system consists of his mother, aunt, and couple friends. Moreover, Joselyn Glassmanyler stated he resides with his mother.   Medical History:  Past Medical History:  Diagnosis Date  . Diabetes mellitus without complication (HCC)   . Hypertension   . Morbid obesity (HCC)   . Sleep apnea   . Synovitis of ankle 09/20/2017   Seen by Dr. Orland Jarredroxler   Past Surgical History:  Procedure Laterality Date  . none    . WISDOM TOOTH EXTRACTION  01/2019   Current Outpatient Medications on File Prior to Visit  Medication Sig Dispense Refill  . dapagliflozin propanediol (FARXIGA) 10 MG TABS tablet Take 10 mg by mouth daily before breakfast. 30 tablet 5  . hydrochlorothiazide (HYDRODIURIL) 25 MG tablet TAKE 1 TABLET DAILY 90 tablet 1  . lisinopril (ZESTRIL) 20 MG tablet TAKE 1 TABLET DAILY 90 tablet 1  . omeprazole (PRILOSEC) 20 MG capsule Take 20 mg by mouth daily as needed.     . sitaGLIPtin-metformin (JANUMET) 50-1000 MG tablet Take 1 tablet by mouth 2 (two) times daily with a meal. 180 tablet 1   No current facility-administered medications on file prior to visit.    Mental Health History: Joselyn Glassmanyler denied a history of therapeutic and psychiatric services. Joselyn Glassmanyler reported there is no history of hospitalizations for psychiatric concerns. Joselyn Glassmanyler denied a family history of mental health related concerns. Joselyn Glassmanyler reported there is no history of trauma including psychological, physical  and sexual abuse, as well as neglect.   Joselyn Glassmanyler described his typical mood lately as "  usually happy, outgoing." Aside from concerns noted above, Orvan reported experiencing decreased  motivation. Kevis denied current alcohol use. He denied tobacco use. He denied illicit/recreational substance use. He denied caffeine intake. Furthermore, [name] indicated he is not experiencing the following: hallucinations and delusions, paranoia, symptoms of mania , social withdrawal, crying spells and panic attacks. He also denied history of and current suicidal ideation, plan, and intent; history of and current homicidal ideation, plan, and intent; and history of and current engagement in self-harm.  The following strengths were reported by [name]: organized, committed, and helpful. The following strengths were observed by this provider: ability to express thoughts and feelings during the therapeutic session, ability to establish and benefit from a therapeutic relationship, willingness to work toward established goal(s) with the clinic and ability to engage in reciprocal conversation.   Legal History: Kalei reported there is no history of legal involvement.   Structured Assessments Results: The Patient Health Questionnaire-9 (PHQ-9) is a self-report measure that assesses symptoms and severity of depression over the course of the last two weeks. Winston obtained a score of 0.  [0= Not at all; 1= Several days; 2= More than half the days; 3= Nearly every day] Little interest or pleasure in doing things 0  Feeling down, depressed, or hopeless 0  Trouble falling or staying asleep, or sleeping too much 0  Feeling tired or having little energy 0  Poor appetite or overeating 0  Feeling bad about yourself --- or that you are a failure or have let yourself or your family down 0  Trouble concentrating on things, such as reading the newspaper or watching television 0  Moving or speaking so slowly that other people could have noticed? Or the opposite --- being so fidgety or restless that you have been moving around a lot more than usual 0  Thoughts that you would be better off dead or hurting yourself in some  way 0  PHQ-9 Score 0    The Generalized Anxiety Disorder-7 (GAD-7) is a brief self-report measure that assesses symptoms of anxiety over the course of the last two weeks. Zayde obtained a score of 0.  [0= Not at all; 1= Several days; 2= Over half the days; 3= Nearly every day] Feeling nervous, anxious, on edge 0  Not being able to stop or control worrying 0  Worrying too much about different things 0  Trouble relaxing 0  Being so restless that it's hard to sit still 0  Becoming easily annoyed or irritable 0  Feeling afraid as if something awful might happen 0  GAD-7 Score 0   Interventions:  Conducted a chart review Focused on rapport building Verbally administered PHQ-9 and GAD-7 for symptom monitoring Verbally administered Food & Mood questionnaire to assess various behaviors related to emotional eating Provided emphatic reflections and validation Collaborated with patient on a treatment goal  Psychoeducation provided regarding physical versus emotional hunger  Provisional DSM-5 Diagnosis(es): 307.59 (F50.8) Other Specified Feeding or Eating Disorder, Emotional Eating Behaviors  Plan: Greogry appears able and willing to participate as evidenced by collaboration on a treatment goal, engagement in reciprocal conversation, and asking questions as needed for clarification. The next appointment will be scheduled in two weeks, which will be via MyChart Video Visit. The following treatment goal was established: increase coping skills. This provider will regularly review the treatment plan and medical chart to keep informed of status changes. Zakariyah expressed understanding and agreement with the initial treatment plan of care. Rollan will be sent a  handout via e-mail to utilize between now and the next appointment to increase awareness of hunger patterns and subsequent eating. [name] provided verbal consent during today's appointment for this provider to send the handout via e-mail.

## 2020-04-15 LAB — LIPID PANEL
Chol/HDL Ratio: 3.5 ratio (ref 0.0–5.0)
Cholesterol, Total: 133 mg/dL (ref 100–199)
HDL: 38 mg/dL — ABNORMAL LOW (ref >39)
LDL Chol Calc (NIH): 75 mg/dL (ref 0–99)
Triglycerides: 109 mg/dL (ref 0–149)
VLDL Cholesterol Cal: 20 mg/dL (ref 5–40)

## 2020-04-15 LAB — T3, FREE: T3, Free: 3.2 pg/mL (ref 2.0–4.4)

## 2020-04-15 LAB — CBC WITH DIFFERENTIAL/PLATELET
Basophils Absolute: 0 10*3/uL (ref 0.0–0.2)
Basos: 0 %
EOS (ABSOLUTE): 0.3 10*3/uL (ref 0.0–0.4)
Eos: 4 %
Hematocrit: 47.2 % (ref 37.5–51.0)
Hemoglobin: 14.6 g/dL (ref 13.0–17.7)
Immature Grans (Abs): 0 10*3/uL (ref 0.0–0.1)
Immature Granulocytes: 0 %
Lymphocytes Absolute: 3 10*3/uL (ref 0.7–3.1)
Lymphs: 40 %
MCH: 25.4 pg — ABNORMAL LOW (ref 26.6–33.0)
MCHC: 30.9 g/dL — ABNORMAL LOW (ref 31.5–35.7)
MCV: 82 fL (ref 79–97)
Monocytes Absolute: 0.5 10*3/uL (ref 0.1–0.9)
Monocytes: 7 %
Neutrophils Absolute: 3.6 10*3/uL (ref 1.4–7.0)
Neutrophils: 49 %
Platelets: 368 10*3/uL (ref 150–450)
RBC: 5.75 x10E6/uL (ref 4.14–5.80)
RDW: 14.9 % (ref 11.6–15.4)
WBC: 7.4 10*3/uL (ref 3.4–10.8)

## 2020-04-15 LAB — HEPATIC FUNCTION PANEL
ALT: 92 IU/L — ABNORMAL HIGH (ref 0–44)
AST: 78 IU/L — ABNORMAL HIGH (ref 0–40)
Albumin: 4.3 g/dL (ref 4.1–5.2)
Alkaline Phosphatase: 69 IU/L (ref 48–121)
Bilirubin Total: 0.5 mg/dL (ref 0.0–1.2)
Bilirubin, Direct: 0.15 mg/dL (ref 0.00–0.40)
Total Protein: 7.5 g/dL (ref 6.0–8.5)

## 2020-04-15 LAB — INSULIN, RANDOM: INSULIN: 44.1 u[IU]/mL — ABNORMAL HIGH (ref 2.6–24.9)

## 2020-04-15 LAB — VITAMIN B12: Vitamin B-12: 355 pg/mL (ref 232–1245)

## 2020-04-15 LAB — TSH: TSH: 1.18 u[IU]/mL (ref 0.450–4.500)

## 2020-04-15 LAB — VITAMIN D 25 HYDROXY (VIT D DEFICIENCY, FRACTURES): Vit D, 25-Hydroxy: 23.6 ng/mL — ABNORMAL LOW (ref 30.0–100.0)

## 2020-04-15 LAB — T4, FREE: Free T4: 1.18 ng/dL (ref 0.82–1.77)

## 2020-04-28 ENCOUNTER — Encounter (INDEPENDENT_AMBULATORY_CARE_PROVIDER_SITE_OTHER): Payer: Self-pay | Admitting: Family Medicine

## 2020-04-28 ENCOUNTER — Telehealth (INDEPENDENT_AMBULATORY_CARE_PROVIDER_SITE_OTHER): Payer: 59 | Admitting: Psychology

## 2020-04-28 ENCOUNTER — Other Ambulatory Visit: Payer: Self-pay

## 2020-04-28 ENCOUNTER — Ambulatory Visit (INDEPENDENT_AMBULATORY_CARE_PROVIDER_SITE_OTHER): Payer: 59 | Admitting: Family Medicine

## 2020-04-28 VITALS — BP 129/85 | HR 86 | Temp 98.4°F | Ht 65.0 in | Wt 365.0 lb

## 2020-04-28 DIAGNOSIS — F5089 Other specified eating disorder: Secondary | ICD-10-CM | POA: Diagnosis not present

## 2020-04-28 DIAGNOSIS — Z6841 Body Mass Index (BMI) 40.0 and over, adult: Secondary | ICD-10-CM

## 2020-04-28 DIAGNOSIS — E119 Type 2 diabetes mellitus without complications: Secondary | ICD-10-CM

## 2020-04-28 DIAGNOSIS — E785 Hyperlipidemia, unspecified: Secondary | ICD-10-CM

## 2020-04-28 DIAGNOSIS — E1169 Type 2 diabetes mellitus with other specified complication: Secondary | ICD-10-CM | POA: Diagnosis not present

## 2020-04-28 DIAGNOSIS — E559 Vitamin D deficiency, unspecified: Secondary | ICD-10-CM | POA: Diagnosis not present

## 2020-04-28 DIAGNOSIS — R7401 Elevation of levels of liver transaminase levels: Secondary | ICD-10-CM

## 2020-04-28 DIAGNOSIS — E1159 Type 2 diabetes mellitus with other circulatory complications: Secondary | ICD-10-CM | POA: Diagnosis not present

## 2020-04-28 DIAGNOSIS — I10 Essential (primary) hypertension: Secondary | ICD-10-CM

## 2020-04-29 ENCOUNTER — Ambulatory Visit (INDEPENDENT_AMBULATORY_CARE_PROVIDER_SITE_OTHER): Payer: 59

## 2020-04-29 ENCOUNTER — Other Ambulatory Visit: Payer: Self-pay

## 2020-04-29 ENCOUNTER — Ambulatory Visit
Admission: EM | Admit: 2020-04-29 | Discharge: 2020-04-29 | Disposition: A | Discharge status: Home / Self Care | Payer: 59 | Attending: Family Medicine | Admitting: Family Medicine

## 2020-04-29 ENCOUNTER — Encounter: Payer: Self-pay | Admitting: Emergency Medicine

## 2020-04-29 DIAGNOSIS — S46911A Strain of unspecified muscle, fascia and tendon at shoulder and upper arm level, right arm, initial encounter: Secondary | ICD-10-CM

## 2020-04-29 NOTE — Discharge Instructions (Addendum)
Rest, ice, over the counter ibuprofen/tylenol as needed

## 2020-04-29 NOTE — ED Provider Notes (Signed)
[facility]    CSN: [id number] Arrival date & time: 04/29/20  1726      History   Chief Complaint Chief Complaint  Patient presents with  . Shoulder Pain  . Fall    HPI Elijah Wells is a 29 y.o. male.   29 yo male with a c/o right shoulder pain since slipping, falling and hitting his shoulder today while hiking. Denies any numbness/tingling to the extremity. Pain is worse with certain movements.    Shoulder Pain Fall    Past Medical History:  Diagnosis Date  . Diabetes mellitus without complication (HCC)   . Hypertension   . Morbid obesity (HCC)   . Sleep apnea   . Synovitis of ankle 09/20/2017   Seen by Dr. Orland Jarred    Patient Active Problem List   Diagnosis Date Noted  . 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 Essential hypertension 09/20/2017  . Gastroesophageal reflux disease 09/20/2017  . BMI 50.0-59.9, adult (HCC) 09/20/2017    Past Surgical History:  Procedure Laterality Date  . none    . WISDOM TOOTH EXTRACTION  01/2019       Home Medications    Prior to Admission medications   Medication Sig Start Date End Date Taking? Authorizing Provider  dapagliflozin propanediol (FARXIGA) 10 MG TABS tablet Take 10 mg by mouth daily before breakfast. 12/26/19  Yes Reubin Milan, MD  hydrochlorothiazide (HYDRODIURIL) 25 MG tablet TAKE 1 TABLET DAILY 12/15/19  Yes Reubin Milan, MD  lisinopril (ZESTRIL) 20 MG tablet TAKE 1 TABLET DAILY 12/15/19  Yes Reubin Milan, MD  omeprazole (PRILOSEC) 20 MG capsule Take 20 mg by mouth daily as needed.    Yes [provider]  sitaGLIPtin-metformin (JANUMET) 50-1000 MG tablet Take 1 tablet by mouth 2 (two) times daily with a meal. 03/31/20  Yes Reubin Milan, MD    Family History Family History  Problem Relation Age of Onset  . Healthy Mother   . Anxiety disorder Mother   . Alcoholism Mother   . Healthy Father   . Breast cancer Maternal Grandmother      Social History Social History   Tobacco Use  . Smoking status: Never Smoker  . Smokeless tobacco: Never Used  Vaping Use  . Vaping Use: Never used  Substance Use Topics  . Alcohol use: No  . Drug use: No     Allergies   Patient has no known allergies.   Review of Systems Review of Systems   Physical Exam Triage Vital Signs ED Triage Vitals  Enc Vitals Group     BP 04/29/20 1759 126/79     Pulse Rate 04/29/20 1759 70     Resp 04/29/20 1759 16     Temp 04/29/20 1759 98.6 F (37 C)     Temp Source 04/29/20 1759 Oral     SpO2 04/29/20 1759 97 %     Weight 04/29/20 1756 (!) 365 lb (165.6 kg)     Height 04/29/20 1756 5\' 5"  (1.651 m)     Head Circumference --      Peak Flow --      Pain Score 04/29/20 1756 7     Pain Loc --      Pain Edu? --      Excl. in GC? --    No data found.  Updated Vital Signs BP 126/79 (BP Location: Left Arm)   Pulse 70   Temp 98.6 F (  37 C) (Oral)   Resp 16   Ht 5\' 5"  (1.651 m)   Wt (!) 165.6 kg   SpO2 97%   BMI 60.74 kg/m   Visual Acuity Right Eye Distance:   Left Eye Distance:   Bilateral Distance:    Right Eye Near:   Left Eye Near:    Bilateral Near:     Physical Exam Vitals and nursing note reviewed.  Constitutional:      General: He is not in acute distress.    Appearance: He is not toxic-appearing or diaphoretic.  Musculoskeletal:     Right shoulder: Tenderness and bony tenderness present. No swelling, deformity, effusion, laceration or crepitus. Decreased range of motion. Normal strength. Normal pulse.     Comments: Right upper extremity neurovascularly intact  Neurological:     Mental Status: He is alert.      UC Treatments / Results  Labs (all labs ordered are listed, but only abnormal results are displayed) Labs Reviewed - No data to display  EKG   Radiology DG Shoulder Right  Result Date: 04/29/2020 CLINICAL DATA:  29 year old male with fall and trauma to the right shoulder. EXAM: RIGHT  SHOULDER - 2+ VIEW COMPARISON:  None. FINDINGS: There is no acute fracture or dislocation. The bones are well mineralized. No arthritic changes. The soft tissues are unremarkable. IMPRESSION: Negative. Electronically Signed   By: 36 M.D.   On: 04/29/2020 19:07    Procedures Procedures (including critical care time)  Medications Ordered in UC Medications - No data to display  Initial Impression / Assessment and Plan / UC Course  I have reviewed the triage vital signs and the nursing notes.  Pertinent labs & imaging results that were available during my care of the patient were reviewed by me and considered in my medical decision making (see chart for details).     Final Clinical Impressions(s) / UC Diagnoses   Final diagnoses:  Strain of right shoulder, initial encounter     Discharge Instructions     Rest, ice, over the counter ibuprofen/tylenol as needed    ED Prescriptions    None      1. x-ray results and diagnosis reviewed with patient 2.. Recommend supportive treatment as above 3. Follow-up prn if symptoms worsen or don't improve   PDMP not reviewed this encounter.   05/01/2020, MD 04/29/20 [phone]

## 2020-04-29 NOTE — ED Triage Notes (Signed)
Patient states that he was hiking today and slipped and fell and injured his right shoulder.  Patient c/o right shoulder pain.

## 2020-05-01 MED ORDER — VITAMIN D (ERGOCALCIFEROL) 1.25 MG (50000 UNIT) PO CAPS: 50000.0000 [IU] | ORAL_CAPSULE | ORAL | 0 refills | Status: DC

## 2020-05-03 NOTE — Progress Notes (Signed)
Chief Complaint:   OBESITY Elijah Wells is here to discuss his progress with his obesity treatment plan along with follow-up of his obesity related diagnoses. Elijah Wells is on the Category 4 Plan and states he is following his eating plan approximately 95% of the time. Elijah Wells states he is hiking/walking for 60 minutes 2 times per week.  Today's visit was #: 2 Starting weight: 381 lbs Starting date: 04/14/2020 Today's weight: 365 lbs Today's date: 04/28/2020 Total lbs lost to date: 16 lbs Total lbs lost since last in-office visit: 16 lbs  Interim History: Elijah Wells had an appointment with Dr. Dewaine Conger today and says it went really well and he liked it.  He says he is a little bored with eating the same things.  His hunger is well-controlled.  He endorses some cravings for snacks at night, but has made arrangements to also have food with him and walks a different way so he will not be tempted by the snack machine.    Subjective:   1. Vitamin D deficiency L[place]'s Vitamin D level was 23.6 on 04/14/2020. He is currently taking no vitamin D supplement. He denies nausea, vomiting or muscle weakness.  2. Type 2 diabetes mellitus without complication, without long-term current use of insulin (HCC) Medications reviewed. Diabetic ROS: no polyuria or polydipsia, no chest pain, dyspnea or TIA's, no numbness, tingling or pain in extremities.  He is taking Janumet and [place] per PCP.  Lab Results  Component Value Date   HGBA1C 8.9 (H) 03/31/2020   HGBA1C 11.8 (H) 12/01/2019   HGBA1C 9.8 (H) 07/24/2019   Lab Results  Component Value Date   LDLCALC 75 04/14/2020   CREATININE 0.97 03/31/2020   Lab Results  Component Value Date   INSULIN 44.1 (H) 04/14/2020   3. Hypertension associated with diabetes (HCC) Review: taking medications as instructed, no medication side effects noted, no chest pain on exertion, no dyspnea on exertion, no swelling of ankles.  He is taking lisinopril and HCTZ.  Blood pressure is at  goal.  Mildly elevated serum creatinine in the past.  Recently <1.0.  BP Readings from Last 3 Encounters:  04/29/20 126/79  04/28/20 129/85  04/14/20 127/77   4. Hyperlipidemia associated with type 2 diabetes mellitus (HCC) Elijah Wells has hyperlipidemia and has been trying to improve his cholesterol levels with intensive lifestyle modification including a low saturated fat diet, exercise and weight loss. He denies any chest pain, claudication or myalgias.  Low HDL, elevated LDL.  Lab Results  Component Value Date   ALT 92 (H) 04/14/2020   AST 78 (H) 04/14/2020   ALKPHOS 69 04/14/2020   BILITOT 0.5 04/14/2020   Lab Results  Component Value Date   CHOL 133 04/14/2020   HDL 38 (L) 04/14/2020   LDLCALC 75 04/14/2020   TRIG 109 04/14/2020   CHOLHDL 3.5 04/14/2020   5. Transaminitis Taray thinks he has had a doctor in the past look into why his liver enzymes were elevated (ultrasound) and he thinks they said "fatty liver".  Lab Results  Component Value Date   ALT 92 (H) 04/14/2020   AST 78 (H) 04/14/2020   ALKPHOS 69 04/14/2020   BILITOT 0.5 04/14/2020   Assessment/Plan:   1. Vitamin D deficiency New.  Discussed labs with patient today.  Low Vitamin D level contributes to fatigue and are associated with obesity, breast, and colon cancer. He agrees to continue to take prescription Vitamin D @50 ,000 IU every week and will follow-up for routine testing  of Vitamin D, at least 2-3 times per year to avoid over-replacement. - Vitamin D, Ergocalciferol, (DRISDOL) 1.25 MG (50000 UNIT) CAPS capsule; Take 1 capsule (50,000 Units total) by mouth every 7 (seven) days.  Dispense: 4 capsule; Refill: 0  2. Type 2 diabetes mellitus without complication, without long-term current use of insulin (HCC) Discussed labs with patient today.  Good blood sugar control is important to decrease the likelihood of diabetic complications such as nephropathy, neuropathy, limb loss, blindness, coronary artery disease,  and death. Intensive lifestyle modification including diet, exercise and weight loss are the first line of treatment for diabetes.  Recheck labs in 3 months, around 07/01/2020.  Will consider adding GLP-1 in the future if not less than 6.5 or if weight loss plateaus.  3. Hypertension associated with diabetes (HCC) Discussed labs with patient today.  Lorenso is working on healthy weight loss and exercise to improve blood pressure control. We will watch for signs of hypotension as he continues his lifestyle modifications.    4. Hyperlipidemia associated with type 2 diabetes mellitus (HCC) Discussed labs with patient today. Cardiovascular risk and specific lipid/LDL goals reviewed.  We discussed several lifestyle modifications today and Akili will continue to work on diet, exercise and weight loss efforts. Orders and follow up as documented in patient record.   Counseling Intensive lifestyle modifications are the first line treatment for this issue. . Dietary changes: Increase soluble fiber. Decrease simple carbohydrates. . Exercise changes: Moderate to vigorous-intensity aerobic activity 150 minutes per week if tolerated. Lipid-lowering medications: see documented in medical record.  5. Transaminitis Discussed labs with patient today.  Recheck labs in 3 months.  Levels are decreased from prior.  6. Class 3 severe obesity with serious comorbidity and body mass index (BMI) of 60.0 to 69.9 in adult, unspecified obesity type [facility]) Elijah Wells is currently in the action stage of change. As such, his goal is to continue with weight loss efforts. He has agreed to the Category 4 Plan.   Exercise goals: As is.  Behavioral modification strategies: increasing water intake, no skipping meals, meal planning and cooking strategies, better snacking choices and planning for success.  Extensive discussion was had regarding ways to prepare meals, alternatives to sandwiches and use of spices, etc.  Recipes handed to  patient today.  Rafay has agreed to follow-up with our clinic in 2 weeks. He was informed of the importance of frequent follow-up visits to maximize his success with intensive lifestyle modifications for his multiple health conditions.   Objective:   Blood pressure 129/85, pulse 86, temperature 98.4 F (36.9 C), temperature source Oral, height 5\' 5"  (1.651 m), weight (!) 365 lb (165.6 kg), SpO2 95 %. Body mass index is 60.74 kg/m.  General: Cooperative, alert, well developed, in no acute distress. HEENT: Conjunctivae and lids unremarkable. Cardiovascular: Regular rhythm.  Lungs: Normal work of breathing. Neurologic: No focal deficits.   Lab Results  Component Value Date   CREATININE 0.97 03/31/2020   BUN 12 03/31/2020   NA 136 03/31/2020   K 4.1 03/31/2020   CL 98 03/31/2020   CO2 25 03/31/2020   Lab Results  Component Value Date   ALT 92 (H) 04/14/2020   AST 78 (H) 04/14/2020   ALKPHOS 69 04/14/2020   BILITOT 0.5 04/14/2020   Lab Results  Component Value Date   HGBA1C 8.9 (H) 03/31/2020   HGBA1C 11.8 (H) 12/01/2019   HGBA1C 9.8 (H) 07/24/2019   HGBA1C 6.5 (H) 09/26/2018   HGBA1C 6.5 (H) 05/27/2018  Lab Results  Component Value Date   INSULIN 44.1 (H) 04/14/2020   Lab Results  Component Value Date   TSH 1.180 04/14/2020   Lab Results  Component Value Date   CHOL 133 04/14/2020   HDL 38 (L) 04/14/2020   LDLCALC 75 04/14/2020   TRIG 109 04/14/2020   CHOLHDL 3.5 04/14/2020   Lab Results  Component Value Date   WBC 7.4 04/14/2020   HGB 14.6 04/14/2020   HCT 47.2 04/14/2020   MCV 82 04/14/2020   PLT 368 04/14/2020   Attestation Statements:   Reviewed by clinician on day of visit: allergies, medications, problem list, medical history, surgical history, family history, social history, and previous encounter notes.  Time spent on visit including pre-visit chart review and post-visit care and charting was 55 minutes.   I, Insurance claims handler, CMA, am acting as  Energy manager for [employer], DO.  I have reviewed the above documentation for accuracy and completeness, and I agree with the above. Thomasene Lot, DO

## 2020-05-11 ENCOUNTER — Other Ambulatory Visit: Payer: Self-pay

## 2020-05-11 ENCOUNTER — Ambulatory Visit (INDEPENDENT_AMBULATORY_CARE_PROVIDER_SITE_OTHER): Payer: 59 | Admitting: Physician Assistant

## 2020-05-11 ENCOUNTER — Encounter (INDEPENDENT_AMBULATORY_CARE_PROVIDER_SITE_OTHER): Payer: Self-pay | Admitting: Physician Assistant

## 2020-05-11 VITALS — BP 117/75 | HR 86 | Temp 98.1°F | Ht 65.0 in | Wt 360.0 lb

## 2020-05-11 DIAGNOSIS — E559 Vitamin D deficiency, unspecified: Secondary | ICD-10-CM

## 2020-05-11 DIAGNOSIS — E118 Type 2 diabetes mellitus with unspecified complications: Secondary | ICD-10-CM

## 2020-05-11 DIAGNOSIS — Z6841 Body Mass Index (BMI) 40.0 and over, adult: Secondary | ICD-10-CM | POA: Diagnosis not present

## 2020-05-12 ENCOUNTER — Telehealth (INDEPENDENT_AMBULATORY_CARE_PROVIDER_SITE_OTHER): Payer: 59 | Admitting: Psychology

## 2020-05-12 DIAGNOSIS — F5089 Other specified eating disorder: Secondary | ICD-10-CM

## 2020-05-12 NOTE — Progress Notes (Signed)
  Office: [phone]  /  Fax: [phone]    Date: May 12, 2020    Appointment Start Time: 4:30pm Duration: 31 minutes Provider: Lawerance Cruel, Psy.D. Type of Session: Individual Therapy  Location of Patient: Home Location of Provider: Provider's Home Type of Contact: Telepsychological Visit via MyChart Video Visit  Session Content: Veryl is a 29 y.o. male presenting via MyChart Video Visit for a follow-up appointment to address the previously established treatment goal of increasing coping skills. Today's appointment was a telepsychological visit due to COVID-19. [name] provided verbal consent for today's telepsychological appointment and he is aware he is responsible for securing confidentiality on his end of the session. Prior to proceeding with today's appointment, Xaden's physical location at the time of this appointment was obtained as well a phone number he could be reached at in the event of technical difficulties. [name] and this provider participated in today's telepsychological service.   This provider conducted a brief check-in. Lukas shared about his appointments with the clinic, adding he lost 21 pounds. Emotional and physical hunger were reviewed. He noted, "I recognized those moments" related to emotional hunger. Positive reinforcement was provided. Psychoeducation regarding triggers for emotional eating was provided. Caz was provided a handout, and encouraged to utilize the handout between now and the next appointment to increase awareness of triggers and frequency. [name] agreed. This provider also discussed behavioral strategies for specific triggers, such as placing the utensil down when conversing to avoid mindless eating. [name] provided verbal consent during today's appointment for this provider to send a handout about triggers via e-mail. Jaekwon was receptive to today's appointment as evidenced by openness to sharing, responsiveness to feedback, and willingness to explore  triggers for emotional eating.  Mental Status Examination:  Appearance: well groomed and appropriate hygiene  Behavior: appropriate to circumstances Mood: euthymic Affect: mood congruent Speech: normal in rate, volume, and tone Eye Contact: appropriate Psychomotor Activity: appropriate Gait: unable to assess Thought Process: linear, logical, and goal directed  Thought Content/Perception: no hallucinations, delusions, bizarre thinking or behavior reported or observed and no evidence of suicidal and homicidal ideation, plan, and intent Orientation: time, person, place, and purpose of appointment Memory/Concentration: memory, attention, language, and fund of knowledge intact  Insight/Judgment: good   Interventions:  Conducted a brief chart review Provided empathic reflections and validation Reviewed content from the previous session Provided positive reinforcement Employed supportive psychotherapy interventions to facilitate reduced distress and to improve coping skills with identified stressors Psychoeducation provided regarding triggers for emotional eating  DSM-5 Diagnosis(es): 307.59 (F50.8) Other Specified Feeding or Eating Disorder, Emotional Eating Behaviors  Treatment Goal & Progress: During the initial appointment with this provider, the following treatment goal was established: increase coping skills. Danner has demonstrated progress in his goal as evidenced by increased awareness of hunger patterns.   Plan: Based on recent progress, frequency of appointments will be reduced. As such, the next appointment will be scheduled in three weeks, which will be via MyChart Video Visit. The next session will focus on working towards the established treatment goal.

## 2020-05-12 NOTE — Progress Notes (Signed)
Chief Complaint:   OBESITY Elijah Wells is here to discuss his progress with his obesity treatment plan along with follow-up of his obesity related diagnoses. Elijah Wells is on the Category 4 Plan and states he is following his eating plan approximately 90% of the time. Elijah Wells states he is walking/hiking 60 minutes 2 times per week.  Today's visit was #: 3 Starting weight: 381 lbs Starting date: 04/14/2020 Today's weight: 360 lbs Today's date: 05/11/2020 Total lbs lost to date: 21 Total lbs lost since last in-office visit: 5  Interim History: Elijah Wells has done very well with weight loss. His only struggle with the plan is getting in all of the protein at dinner. He is making good food substitutions when needed.  Subjective:   Diabetes mellitus type 2 with complications (HCC)uncontrolled. Elijah Wells is on [place]. He denies hypoglycemia.   Lab Results  Component Value Date   HGBA1C 8.9 (H) 03/31/2020   HGBA1C 11.8 (H) 12/01/2019   HGBA1C 9.8 (H) 07/24/2019   Lab Results  Component Value Date   LDLCALC 75 04/14/2020   CREATININE 0.97 03/31/2020   Lab Results  Component Value Date   INSULIN 44.1 (H) 04/14/2020   Vitamin D deficiency. Elijah Wells is on Vitamin D supplementation. No nausea, vomiting, or muscle weakness.    Ref. Range 04/14/2020 14:30  Vitamin D, 25-Hydroxy Latest Ref Range: 30.0 - 100.0 ng/mL 23.6 (L)   Assessment/Plan:   Diabetes mellitus type 2 with complications (HCC)uncontrolled. Good blood sugar control is important to decrease the likelihood of diabetic complications such as nephropathy, neuropathy, limb loss, blindness, coronary artery disease, and death. Intensive lifestyle modification including diet, exercise and weight loss are the first line of treatment for diabetes. Elijah Wells will continue his medications as directed.   Vitamin D deficiency. Low Vitamin D level contributes to fatigue and are associated with obesity, breast, and colon cancer. He agrees to  continue to take Vitamin D as directed and will follow-up for routine testing of Vitamin D, at least 2-3 times per year to avoid over-replacement.  Class 3 severe obesity with serious comorbidity and body mass index (BMI) of 60.0 to 69.9 in adult, unspecified obesity type (HCC).  Elijah Wells is currently in the action stage of change. As such, his goal is to continue with weight loss efforts. He has agreed to the Category 4 Plan.   Exercise goals: For substantial health benefits, adults should do at least 150 minutes (2 hours and 30 minutes) a week of moderate-intensity, or 75 minutes (1 hour and 15 minutes) a week of vigorous-intensity aerobic physical activity, or an equivalent combination of moderate- and vigorous-intensity aerobic activity. Aerobic activity should be performed in episodes of at least 10 minutes, and preferably, it should be spread throughout the week.  Behavioral modification strategies: meal planning and cooking strategies and keeping healthy foods in the home.  Elijah Wells has agreed to follow-up with our clinic in 2 weeks. He was informed of the importance of frequent follow-up visits to maximize his success with intensive lifestyle modifications for his multiple health conditions.   Objective:   Blood pressure 117/75, pulse 86, temperature 98.1 F (36.7 C), temperature source Oral, height 5\' 5"  (1.651 m), weight (!) 360 lb (163.3 kg), SpO2 98 %. Body mass index is 59.91 kg/m.  General: Cooperative, alert, well developed, in no acute distress. HEENT: Conjunctivae and lids unremarkable. Cardiovascular: Regular rhythm.  Lungs: Normal work of breathing. Neurologic: No focal deficits.   Lab Results  Component  Value Date   CREATININE 0.97 03/31/2020   BUN 12 03/31/2020   NA 136 03/31/2020   K 4.1 03/31/2020   CL 98 03/31/2020   CO2 25 03/31/2020   Lab Results  Component Value Date   ALT 92 (H) 04/14/2020   AST 78 (H) 04/14/2020   ALKPHOS 69 04/14/2020   BILITOT 0.5  04/14/2020   Lab Results  Component Value Date   HGBA1C 8.9 (H) 03/31/2020   HGBA1C 11.8 (H) 12/01/2019   HGBA1C 9.8 (H) 07/24/2019   HGBA1C 6.5 (H) 09/26/2018   HGBA1C 6.5 (H) 05/27/2018   Lab Results  Component Value Date   INSULIN 44.1 (H) 04/14/2020   Lab Results  Component Value Date   TSH 1.180 04/14/2020   Lab Results  Component Value Date   CHOL 133 04/14/2020   HDL 38 (L) 04/14/2020   LDLCALC 75 04/14/2020   TRIG 109 04/14/2020   CHOLHDL 3.5 04/14/2020   Lab Results  Component Value Date   WBC 7.4 04/14/2020   HGB 14.6 04/14/2020   HCT 47.2 04/14/2020   MCV 82 04/14/2020   PLT 368 04/14/2020   No results found for: IRON, TIBC, FERRITIN  Attestation Statements:   Reviewed by clinician on day of visit: allergies, medications, problem list, medical history, surgical history, family history, social history, and previous encounter notes.  Time spent on visit including pre-visit chart review and post-visit charting and care was 30 minutes.   IMarianna Payment, am acting as transcriptionist for Alois Cliche, PA-C   I have reviewed the above documentation for accuracy and completeness, and I agree with the above. Alois Cliche, PA-C

## 2020-05-19 NOTE — Progress Notes (Unsigned)
°  Office: [phone]  /  Fax: [phone]    Date: June 02, 2020   Appointment Start Time: *** Duration: *** minutes Provider: Lawerance Cruel, Psy.D. Type of Session: Individual Therapy  Location of Patient: {gbptloc:23249} Location of Provider: {Location of Service:22491} Type of Contact: Telepsychological Visit via MyChart Video Visit  Session Content: This provider called Elijah Wells at 4:32pm as he did not present for the telepsychological appointment. A HIPAA compliant voicemail was left requesting a call back. As such, today's appointment was initiated *** minutes late.  Elijah Wells is a 29 y.o. male presenting for a follow-up appointment to address the previously established treatment goal of increasing coping skills. Today's appointment was a telepsychological visit due to COVID-19. Elijah Wells provided verbal consent for today's telepsychological appointment and he is aware he is responsible for securing confidentiality on his end of the session. Prior to proceeding with today's appointment, Elijah Wells's physical location at the time of this appointment was obtained as well a phone number he could be reached at in the event of technical difficulties. Elijah Wells and this provider participated in today's telepsychological service.   This provider conducted a brief check-in. ***Triggers for emotional eating reviewed. Psychoeducation regarding mindfulness was provided. A handout was provided to [name] with further information regarding mindfulness, including exercises. This provider also explained the benefit of mindfulness as it relates to emotional eating. [name] was encouraged to engage in the provided exercises between now and the next appointment with this provider. Elijah Wells agreed. During today's appointment, Elijah Wells was led through a mindfulness exercise involving his senses. Elijah Wells provided verbal consent during today's appointment for this provider to send a handout about mindfulenss via e-mail. Furthermore,  termination planning was discussed. Elijah Wells was receptive to a follow-up appointment in 3-4 weeks and an additional follow-up/termination appointment in 3-4 weeks after that.  Elijah Wells was receptive to today's appointment as evidenced by openness to sharing, responsiveness to feedback, and willingness to explore triggers for emotional eating.  Mental Status Examination:  Appearance: well groomed and appropriate hygiene  Behavior: appropriate to circumstances Mood: euthymic Affect: mood congruent Speech: normal in rate, volume, and tone Eye Contact: appropriate Psychomotor Activity: appropriate Gait: unable to assess Thought Process: linear, logical, and goal directed  Thought Content/Perception: no hallucinations, delusions, bizarre thinking or behavior reported or observed and no evidence of suicidal and homicidal ideation, plan, and intent Orientation: time, person, place, and purpose of appointment Memory/Concentration: memory, attention, language, and fund of knowledge intact  Insight/Judgment: good  Interventions:  Conducted a brief chart review Provided empathic reflections and validation Reviewed content from the previous session Employed supportive psychotherapy interventions to facilitate reduced distress and to improve coping skills with identified stressors Psychoeducation provided regarding mindfulness Engaged patient in mindfulness exercise(s) Employed acceptance and commitment interventions to emphasize mindfulness and acceptance without struggle Discussed termination planning  DSM-5 Diagnosis(es): 307.59 (F50.8) Other Specified Feeding or Eating Disorder, Emotional Eating Behaviors  Treatment Goal & Progress: During the initial appointment with this provider, the following treatment goal was established: increase coping skills. [name] has demonstrated progress in his goal as evidenced by increased awareness of hunger patterns and increased awareness of triggers for emotional  eating. [name] also demonstrates willingness to engage in mindfulness exercises.  Plan: The next appointment will be scheduled in {gbweeks:21758}, which will be via MyChart Video Visit. The next session will focus on working towards the established treatment goal.

## 2020-05-25 ENCOUNTER — Ambulatory Visit (INDEPENDENT_AMBULATORY_CARE_PROVIDER_SITE_OTHER): Payer: 59 | Admitting: Physician Assistant

## 2020-06-02 ENCOUNTER — Telehealth (INDEPENDENT_AMBULATORY_CARE_PROVIDER_SITE_OTHER): Payer: Self-pay | Admitting: Psychology

## 2020-06-02 NOTE — Telephone Encounter (Signed)
  Office: [phone]  /  Fax: [phone]  Date of Call: June 02, 2020  Time of Call: 4:32pm Provider: Lawerance Cruel, PsyD  CONTENT: This provider called Elijah Wells to check-in as he did not present for today's MyChart Video Visit appointment at 4:30pm. A HIPAA compliant voicemail was left requesting a call back. Of note, this provider stayed on the MyChart Video Visit appointment for 5 minutes prior to signing off per the clinic's grace period policy.    PLAN: This provider will wait for [name] to call back. No further follow-up planned by this provider.

## 2020-06-06 ENCOUNTER — Ambulatory Visit (INDEPENDENT_AMBULATORY_CARE_PROVIDER_SITE_OTHER): Payer: 59 | Admitting: Physician Assistant

## 2020-06-08 ENCOUNTER — Ambulatory Visit (INDEPENDENT_AMBULATORY_CARE_PROVIDER_SITE_OTHER): Payer: 59 | Admitting: Physician Assistant

## 2020-06-12 ENCOUNTER — Other Ambulatory Visit: Payer: Self-pay | Admitting: Internal Medicine

## 2020-06-14 NOTE — Progress Notes (Unsigned)
  Office: [phone]  /  Fax: [phone]    Date: June 28, 2020   Appointment Start Time: *** Duration: *** minutes Provider: Lawerance Cruel, Psy.D. Type of Session: Individual Therapy  Location of Patient: {gbptloc:23249} Location of Provider: Provider's Home Type of Contact: Telepsychological Visit via MyChart Video Visit  Session Content: This provider called [name] at 4:32pm as he did not present for the telepsychological appointment. A HIPAA compliant voicemail was left requesting a call back. As such, today's appointment was initiated *** minutes late.  Elijah Wells is a 29 y.o. male presenting for a follow-up appointment to address the previously established treatment goal of increasing coping skills. Today's appointment was a telepsychological visit due to COVID-19. [name] provided verbal consent for today's telepsychological appointment and he is aware he is responsible for securing confidentiality on his end of the session. Prior to proceeding with today's appointment, Donterius's physical location at the time of this appointment was obtained as well a phone number he could be reached at in the event of technical difficulties. [name] and this provider participated in today's telepsychological service.   This provider conducted a brief check-in. *** Psychoeducation regarding mindfulness was provided. A handout was provided to [facility] with further information regarding mindfulness, including exercises. This provider also explained the benefit of mindfulness as it relates to emotional eating. Reyhan was encouraged to engage in the provided exercises between now and the next appointment with this provider. [name] agreed. During today's appointment, Jailan was led through a mindfulness exercise involving his senses. [name] provided verbal consent during today's appointment for this provider to send a handout about mindfulness via e-mail. Adeel was receptive to today's appointment as evidenced by openness to  sharing, responsiveness to feedback, and {gbreceptiveness:23401}.  Mental Status Examination:  Appearance: {Appearance:22431} Behavior: {Behavior:22445} Mood: {gbmood:21757} Affect: {Affect:22436} Speech: {Speech:22432} Eye Contact: {Eye Contact:22433} Psychomotor Activity: {Motor Activity:22434} Gait: {gbgait:23404} Thought Process: {thought process:22448}  Thought Content/Perception: {disturbances:22451} Orientation: {Orientation:22437} Memory/Concentration: {gbcognition:22449} Insight/Judgment: {Insight:22446}  Interventions:  {Interventions for Progress Notes:23405}  DSM-5 Diagnosis(es): 307.59 (F50.8) Other Specified Feeding or Eating Disorder, Emotional Eating Behaviors  Treatment Goal & Progress: During the initial appointment with this provider, the following treatment goal was established: increase coping skills. Quindell has demonstrated progress in his goal as evidenced by {gbtxprogress:22839}. Masato also {gbtxprogress2:22951}.  Plan: The next appointment will be scheduled in {gbweeks:21758}, which will be {gbtxmodality:23402}. The next session will focus on {Plan for Next Appointment:23400}.

## 2020-06-21 ENCOUNTER — Encounter (INDEPENDENT_AMBULATORY_CARE_PROVIDER_SITE_OTHER): Payer: Self-pay | Admitting: Adult Health

## 2020-06-21 ENCOUNTER — Ambulatory Visit (INDEPENDENT_AMBULATORY_CARE_PROVIDER_SITE_OTHER): Payer: 59 | Admitting: Adult Health

## 2020-06-21 ENCOUNTER — Other Ambulatory Visit: Payer: Self-pay

## 2020-06-21 VITALS — BP 115/78 | HR 107 | Temp 98.0°F | Ht 65.0 in | Wt 345.0 lb

## 2020-06-21 DIAGNOSIS — I10 Essential (primary) hypertension: Secondary | ICD-10-CM | POA: Diagnosis not present

## 2020-06-21 DIAGNOSIS — Z9189 Other specified personal risk factors, not elsewhere classified: Secondary | ICD-10-CM

## 2020-06-21 DIAGNOSIS — E1122 Type 2 diabetes mellitus with diabetic chronic kidney disease: Secondary | ICD-10-CM | POA: Diagnosis not present

## 2020-06-21 DIAGNOSIS — E559 Vitamin D deficiency, unspecified: Secondary | ICD-10-CM

## 2020-06-21 DIAGNOSIS — Z6841 Body Mass Index (BMI) 40.0 and over, adult: Secondary | ICD-10-CM

## 2020-06-21 MED ORDER — VITAMIN D (ERGOCALCIFEROL) 1.25 MG (50000 UNIT) PO CAPS: 50000.0000 [IU] | ORAL_CAPSULE | ORAL | 0 refills | Status: DC

## 2020-06-22 NOTE — Progress Notes (Signed)
Chief Complaint:   OBESITY Elijah Wells is here to discuss his progress with his obesity treatment plan along with follow-up of his obesity related diagnoses. Elijah Wells is on the Category 4 Plan and states he is following his eating plan approximately 85% of the time. Elijah Wells states he is walking 120 minutes 5 times per week.  Today's visit was #: 4 Starting weight: 381 lbs Starting date: 04/14/2020 Today's weight: 345 lbs Today's date: 06/21/2020 Total lbs lost to date: 36 Total lbs lost since last in-office visit: 15  Interim History: Since his recent weight loss, Elijah Wells reports increased energy and decreased dyspnea with activity. He was able to shop in a non-XL clothing shop last week for the first time in 6 years. He continues to enjoy the foods and structure of the Category 4 meal plan. He reports excellent support from his mother.  Subjective:   Type 2 diabetes mellitus with chronic kidney disease, without long-term current use of insulin, unspecified CKD stage (HCC). Elijah Wells is on sitagliptin/metformin 50/1000 mg BID and dapagliflozin 10 mg daily. Ambulatory fasting blood glucose levels are in the 140's.   Lab Results  Component Value Date   HGBA1C 8.9 (H) 03/31/2020   HGBA1C 11.8 (H) 12/01/2019   HGBA1C 9.8 (H) 07/24/2019   Lab Results  Component Value Date   LDLCALC 75 04/14/2020   CREATININE 0.97 03/31/2020   Lab Results  Component Value Date   INSULIN 44.1 (H) 04/14/2020   Essential hypertension. Blood pressure is at goal; heart rate is slightly elevated. Elijah Wells denies chest pain, dyspnea, or palpitations. He is on HCTZ 25 mg daily and lisinopril 20 mg daily. He consumed diet [facility] just prior to today's office visit.  BP Readings from Last 3 Encounters:  06/21/20 115/78  05/11/20 117/75  04/29/20 126/79   Lab Results  Component Value Date   CREATININE 0.97 03/31/2020   CREATININE 1.03 12/01/2019   CREATININE 0.99 07/24/2019   Vitamin D deficiency. Elijah Wells  is on Ergocalciferol. No nausea, vomiting, or muscle weakness.    Ref. Range 04/14/2020 14:30  Vitamin D, 25-Hydroxy Latest Ref Range: 30.0 - 100.0 ng/mL 23.6 (L)   At risk for hypoglycemia. Elijah Wells is at increased risk for hypoglycemia due to continued steady weight loss. He is on multiple anti-diabetic prescriptions.   Assessment/Plan:   Type 2 diabetes mellitus with chronic kidney disease, without long-term current use of insulin, unspecified CKD stage (HCC). Good blood sugar control is important to decrease the likelihood of diabetic complications such as nephropathy, neuropathy, limb loss, blindness, coronary artery disease, and death. Intensive lifestyle modification including diet, exercise and weight loss are the first line of treatment for diabetes. Elijah Wells will continue his current anti-diabetic prescription regimen as directed. Will closely monitor blood glucose levels, especially with continued weight loss. Labs will be checked in October, 2021.   Essential hypertension. Elijah Wells is working on healthy weight loss and exercise to improve blood pressure control. We will watch for signs of hypotension as he continues his lifestyle modifications. He will continue his current anti-hypertensive regimen as directed. He will limit his soda intake. Labs will be checked in October 2021.  Vitamin D deficiency.  Low Vitamin D level contributes to fatigue and are associated with obesity, breast, and colon cancer. He was given a refill on his Vitamin D, Ergocalciferol, (DRISDOL) 1.25 MG (50000 UNIT) CAPS capsule every week #4 with 0 refills and will follow-up for routine testing of Vitamin D in October 2021.  At risk for hypoglycemia. Elijah Wells was given approximately 15 minutes of counseling today regarding prevention of hypoglycemia. He was advised of symptoms of hypoglycemia. Elijah Wells was instructed to avoid skipping meals, eat regular protein rich meals and schedule low calorie snacks as needed.   Repetitive  spaced learning was employed today to elicit superior memory formation and behavioral change.  Class 3 severe obesity with serious comorbidity and body mass index (BMI) of 50.0 to 59.9 in adult, unspecified obesity type (HCC).  Elijah Wells is currently in the action stage of change. As such, his goal is to continue with weight loss efforts. He has agreed to the Category 4 Plan.   Exercise goals: Elijah Wells will continue his current exercise regimen.   Behavioral modification strategies: increasing lean protein intake, increasing water intake, no skipping meals and planning for success.  Elijah Wells has agreed to follow-up with our clinic in 2 weeks. He was informed of the importance of frequent follow-up visits to maximize his success with intensive lifestyle modifications for his multiple health conditions.   Objective:   Blood pressure 115/78, pulse (!) 107, temperature 98 F (36.7 C), temperature source Oral, height 5\' 5"  (1.651 m), weight (!) 345 lb (156.5 kg), SpO2 98 %. Body mass index is 57.41 kg/m.  General: Cooperative, alert, well developed, in no acute distress. HEENT: Conjunctivae and lids unremarkable. Cardiovascular: Regular rhythm.  Lungs: Normal work of breathing. Neurologic: No focal deficits.   Lab Results  Component Value Date   CREATININE 0.97 03/31/2020   BUN 12 03/31/2020   NA 136 03/31/2020   K 4.1 03/31/2020   CL 98 03/31/2020   CO2 25 03/31/2020   Lab Results  Component Value Date   ALT 92 (H) 04/14/2020   AST 78 (H) 04/14/2020   ALKPHOS 69 04/14/2020   BILITOT 0.5 04/14/2020   Lab Results  Component Value Date   HGBA1C 8.9 (H) 03/31/2020   HGBA1C 11.8 (H) 12/01/2019   HGBA1C 9.8 (H) 07/24/2019   HGBA1C 6.5 (H) 09/26/2018   HGBA1C 6.5 (H) 05/27/2018   Lab Results  Component Value Date   INSULIN 44.1 (H) 04/14/2020   Lab Results  Component Value Date   TSH 1.180 04/14/2020   Lab Results  Component Value Date   CHOL 133 04/14/2020   HDL 38 (L)  04/14/2020   LDLCALC 75 04/14/2020   TRIG 109 04/14/2020   CHOLHDL 3.5 04/14/2020   Lab Results  Component Value Date   WBC 7.4 04/14/2020   HGB 14.6 04/14/2020   HCT 47.2 04/14/2020   MCV 82 04/14/2020   PLT 368 04/14/2020   No results found for: IRON, TIBC, FERRITIN  Attestation Statements:   Reviewed by clinician on day of visit: allergies, medications, problem list, medical history, surgical history, family history, social history, and previous encounter notes.  I, 06/15/2020, am acting as [place] for [employer] manager, NP-C   I have reviewed the above documentation for accuracy and completeness, and I agree with the above. -  [employer], NP

## 2020-06-23 DIAGNOSIS — E559 Vitamin D deficiency, unspecified: Secondary | ICD-10-CM | POA: Insufficient documentation

## 2020-06-28 ENCOUNTER — Encounter (INDEPENDENT_AMBULATORY_CARE_PROVIDER_SITE_OTHER): Payer: Self-pay

## 2020-06-28 ENCOUNTER — Telehealth (INDEPENDENT_AMBULATORY_CARE_PROVIDER_SITE_OTHER): Payer: Self-pay | Admitting: Psychology

## 2020-06-28 ENCOUNTER — Telehealth (INDEPENDENT_AMBULATORY_CARE_PROVIDER_SITE_OTHER): Payer: 59 | Admitting: Psychology

## 2020-06-28 NOTE — Telephone Encounter (Signed)
°  Office: [phone]  /  Fax: [phone]  Date of Call: June 28, 2020  Time of Call: 4:32pm Provider: Lawerance Cruel, PsyD  CONTENT: This provider called [name] to check-in as he did not present for today's MyChart Video Visit appointment at 4:30pm. A HIPAA compliant voicemail was left requesting a call back. Of note, this provider stayed on the MyChart Video Visit appointment for 5 minutes prior to signing off per the clinic's grace period policy.    PLAN: This provider will wait for Alyaan to call back. No further follow-up planned by this provider.

## 2020-06-30 NOTE — Progress Notes (Signed)
Office: [phone]  /  Fax: [phone]    Date: July 11, 2020   Appointment Start Time: 2:03pm Duration: 24 minutes Provider: Lawerance Cruel, Psy.D. Type of Session: Individual Therapy  Location of Patient: Parked in car at work Location of Provider: Provider's Home Type of Contact: Telepsychological Visit via MyChart Video Visit  Session Content: Elijah Wells is a 29 y.o. male presenting for a follow-up appointment to address the previously established treatment goal of increasing coping skills. Today's appointment was a telepsychological visit due to COVID-19. Elijah Wells provided verbal consent for today's telepsychological appointment and he is aware he is responsible for securing confidentiality on his end of the session. Prior to proceeding with today's appointment, Elijah Wells's physical location at the time of this appointment was obtained as well a phone number he could be reached at in the event of technical difficulties. Elijah Wells and this provider participated in today's telepsychological service.   This provider conducted a brief check-in. 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 Regarding eating, Elijah Wells reported, "Things have been going really great." He noted he continues to lose weight. Elijah Wells acknowledged he is getting bored with vegetables, noting a desire to "dress them up." As such, this provider engaged Elijah Wells in brainstorming ways to use his snack calories to add flavor to vegetables. Triggers for emotional eating were reviewed. Elijah Wells reported some instances of emotional eating since the last appointment with this provider, but described making better choices and engaging in portion control. Positive reinforcement was provided. Moreover, psychoeducation regarding mindfulness was provided to assist with coping. A handout was provided to [facility] with further information regarding mindfulness, including exercises. This provider also explained the benefit of mindfulness  as it relates to emotional eating. Elijah Wells was encouraged to engage in the provided exercises between now and the next appointment with this provider. Elijah Wells agreed. During today's appointment, Elijah Wells was led through a mindfulness exercise involving his senses. Elijah Wells provided verbal consent during today's appointment for this provider to send a handout about mindfulness via e-mail. Notably, this provider discussed her upcoming maternity leave toward the end of November. Elijah Wells acknowledged understanding given the uncertain nature of the circumstances, this provider may be out of the office sooner. This provider and Elijah Wells discussed referral options and verbal consent was provided for this provider to send a list of referral options via e-mail. All questions/concerns were addressed. Elijah Wells denied any concerns. Elijah Wells was receptive to today's appointment as evidenced by openness to sharing, responsiveness to feedback, and willingness to engage in mindfulness exercises to assist with coping.  Mental Status Examination:  Appearance: well groomed and appropriate hygiene  Behavior: appropriate to circumstances Mood: euthymic Affect: mood congruent Speech: normal in rate, volume, and tone Eye Contact: appropriate Psychomotor Activity: appropriate Gait: unable to assess Thought Process: linear, logical, and goal directed  Thought Content/Perception: no hallucinations, delusions, bizarre thinking or behavior reported or observed and no evidence of suicidal and homicidal ideation, plan, and intent Orientation: time, person, place, and purpose of appointment Memory/Concentration: memory, attention, language, and fund of knowledge intact  Insight/Judgment: good  Interventions:  Conducted a brief chart review Provided empathic reflections and validation Reviewed content from the previous session Employed supportive psychotherapy interventions to facilitate reduced distress and to improve coping skills with  identified stressors Psychoeducation provided regarding mindfulness Engaged patient in mindfulness exercise(s) Employed acceptance and commitment interventions to emphasize mindfulness and acceptance without struggle  DSM-5 Diagnosis(es): 307.59 (F50.8) Other Specified Feeding or Eating Disorder, Emotional Eating Behaviors  Treatment Goal & Progress:  During the initial appointment with this provider, the following treatment goal was established: increase coping skills. Elijah Wells has demonstrated progress in his goal as evidenced by increased awareness of hunger patterns and increased awareness of triggers for emotional eating. Elijah Wells also demonstrates willingness to engage in mindfulness exercises.  Plan: The next appointment will be scheduled in three weeks, which will be via MyChart Video Visit. The next session will focus on working towards the established treatment goal. Additionally, Elijah Wells reported he will call the clinic to reschedule his appointment witt Elijah Hamburger, NP.

## 2020-07-05 ENCOUNTER — Ambulatory Visit (INDEPENDENT_AMBULATORY_CARE_PROVIDER_SITE_OTHER): Payer: 59 | Admitting: Adult Health

## 2020-07-11 ENCOUNTER — Telehealth (INDEPENDENT_AMBULATORY_CARE_PROVIDER_SITE_OTHER): Payer: 59 | Admitting: Psychology

## 2020-07-11 ENCOUNTER — Telehealth: Payer: Self-pay

## 2020-07-11 ENCOUNTER — Other Ambulatory Visit: Payer: Self-pay

## 2020-07-11 DIAGNOSIS — F5089 Other specified eating disorder: Secondary | ICD-10-CM | POA: Diagnosis not present

## 2020-07-11 NOTE — Telephone Encounter (Signed)
Completed PA on [email or web address] for patients Janumet 50-100.  Received APPROVAL from 07/08/2020 to 07/08/2021.  CM

## 2020-07-18 NOTE — Progress Notes (Signed)
Office: [phone]  /  Fax: [phone]    Date: August 01, 2020   Appointment Start Time: 1:59pm Duration: 20 minutes Provider: Lawerance Cruel, Psy.D. Type of Session: Individual Therapy  Location of Patient: Parked in car outside [employer] in [place], [place] Location of Provider: Provider's Home Type of Contact: Telepsychological Visit via MyChart Video Visit  Session Content: Elijah Wells is a 29 y.o. male presenting for a follow-up appointment to address the previously established treatment goal of increasing coping skills. Today's appointment was a telepsychological visit due to COVID-19. Elijah Wells provided verbal consent for today's telepsychological appointment and he is aware he is responsible for securing confidentiality on his end of the session. Prior to proceeding with today's appointment, Elijah Wells's physical location at the time of this appointment was obtained as well a phone number he could be reached at in the event of technical difficulties. Elijah Wells and this provider participated in today's telepsychological service.   This provider conducted a brief check-in. Elijah Wells stated, "Everything is going good," noting there have been "some [positive] changes in [his] life." He also shared about an upcoming vacation to [place]. Regarding eating, Elijah Wells acknowledged challenges due to not having time to go grocery shopping. As a result, he reported eating out; however, described making better choices and engaging in portion control. Positive reinforcement was provided. Session focused further on mindfulness to assist with coping. He shared about moments where he engaged in mindfulness exercises. Positive reinforcement was provided. Elijah Wells was led through a mindfulness exercise (A Taste of Mindfulness) and his experience was processed. Elijah Wells provided verbal consent during today's appointment for this provider to send the handout for today's exercise via e-mail. This provider also discussed the utilization of  YouTube for mindfulness exercises (e.g., exercises by Rhae Hammock). Furthermore, termination planning was discussed. Elijah Wells was receptive to a follow-up appointment in 2-3 weeks and an additional follow-up/termination appointment in 2-3 weeks after that. Elijah Wells was receptive to today's appointment as evidenced by openness to sharing, responsiveness to feedback, and willingness to continue engaging in mindfulness exercises.  Mental Status Examination:  Appearance: well groomed and appropriate hygiene  Behavior: appropriate to circumstances Mood: euthymic Affect: mood congruent Speech: normal in rate, volume, and tone Eye Contact: appropriate Psychomotor Activity: appropriate Gait: unable to assess Thought Process: linear, logical, and goal directed  Thought Content/Perception: no hallucinations, delusions, bizarre thinking or behavior reported or observed and no evidence of suicidal and homicidal ideation, plan, and intent Orientation: time, person, place, and purpose of appointment Memory/Concentration: memory, attention, language, and fund of knowledge intact  Insight/Judgment: good  Interventions:  Conducted a brief chart review Provided empathic reflections and validation Reviewed content from the previous session Employed supportive psychotherapy interventions to facilitate reduced distress and to improve coping skills with identified stressors Engaged patient in mindfulness exercise(s) Employed acceptance and commitment interventions to emphasize mindfulness and acceptance without struggle Discussed termination planning  Provided positive reinforcement   DSM-5 Diagnosis(es): 307.59 (F50.8) Other Specified Feeding or Eating Disorder, Emotional Eating Behaviors  Treatment Goal & Progress: During the initial appointment with this provider, the following treatment goal was established: increase coping skills. Elijah has demonstrated progress in his goal as evidenced by increased  awareness of hunger patterns and increased awareness of triggers for emotional eating. Wells also continues to demonstrate willingness to engage in learned skill(s).  Plan: The next appointment will be scheduled in 2-3 weeks, which will be via MyChart Video Visit. The next session will focus on working towards the established treatment goal.

## 2020-07-22 ENCOUNTER — Encounter: Payer: Self-pay | Admitting: Internal Medicine

## 2020-07-24 NOTE — Progress Notes (Signed)
Date:  07/25/2020   Name:  Elijah Wells   DOB:  [birth year]   MRN:  [id number]   Chief Complaint: Annual Exam (Foot exam. Flu shot- reg dose. )  Elijah Wells is a 29 y.o. male who presents today for his Complete Annual Exam. He feels well. He reports exercising - walking 3 x weekly. He reports he is sleeping well. He is pleased with his progress   Immunization History  Administered Date(s) Administered  . Influenza,inj,Quad PF,6+ Mos 09/20/2017, 08/08/2018, 07/24/2019  . Moderna SARS-COVID-2 Vaccination 12/14/2019, 01/27/2020  . Pneumococcal Polysaccharide-23 07/24/2019    Hypertension This is a chronic problem. The problem is controlled. Pertinent negatives include no chest pain, headaches, palpitations or shortness of breath. Past treatments include ACE inhibitors and diuretics. The current treatment provides significant improvement. There are no compliance problems.   Diabetes He presents for his follow-up diabetic visit. He has type 2 diabetes mellitus. His disease course has been improving. Pertinent negatives for hypoglycemia include no dizziness or headaches. Pertinent negatives for diabetes include no chest pain and no fatigue. Current diabetic treatment includes oral agent (triple therapy). He is compliant with treatment all of the time. His weight is decreasing steadily. He is following a generally healthy diet. He participates in exercise three times a week. An ACE inhibitor/angiotensin II receptor blocker is being taken. Eye exam is not current.  Obesity - he is working with [facility] and has lost significant weight since starting in July - about 45 lbs so far.   Lab Results  Component Value Date   CREATININE 0.97 03/31/2020   BUN 12 03/31/2020   NA 136 03/31/2020   K 4.1 03/31/2020   CL 98 03/31/2020   CO2 25 03/31/2020   Lab Results  Component Value Date   CHOL 133 04/14/2020   HDL 38 (L) 04/14/2020   LDLCALC 75 04/14/2020   TRIG 109  04/14/2020   CHOLHDL 3.5 04/14/2020   Lab Results  Component Value Date   TSH 1.180 04/14/2020   Lab Results  Component Value Date   HGBA1C 8.9 (H) 03/31/2020   Lab Results  Component Value Date   WBC 7.4 04/14/2020   HGB 14.6 04/14/2020   HCT 47.2 04/14/2020   MCV 82 04/14/2020   PLT 368 04/14/2020   Lab Results  Component Value Date   ALT 92 (H) 04/14/2020   AST 78 (H) 04/14/2020   ALKPHOS 69 04/14/2020   BILITOT 0.5 04/14/2020     Review of Systems  Constitutional: Negative for appetite change, chills, diaphoresis, fatigue and unexpected weight change.  HENT: Negative for hearing loss, tinnitus, trouble swallowing and voice change.   Eyes: Positive for discharge. Negative for visual disturbance.  Respiratory: Negative for choking, shortness of breath and wheezing.   Cardiovascular: Negative for chest pain, palpitations and leg swelling.  Gastrointestinal: Negative for abdominal pain, blood in stool, constipation and diarrhea.  Genitourinary: Negative for difficulty urinating, dysuria, frequency, scrotal swelling and testicular pain.  Musculoskeletal: Negative for arthralgias, back pain and myalgias.  Skin: Negative for color change and rash.  Neurological: Negative for dizziness, syncope and headaches.  Hematological: Negative for adenopathy.  Psychiatric/Behavioral: Negative for dysphoric mood and sleep disturbance.    Patient Active Problem List   Diagnosis Date Noted  . Vitamin D deficiency 06/23/2020  . Class 3 severe obesity with serious comorbidity and body mass index (BMI) of 50.0 to 59.9 in adult (HCC) 06/23/2020  . Type II diabetes  mellitus with complication (HCC) 09/20/2017  . Essential hypertension 09/20/2017  . Gastroesophageal reflux disease 09/20/2017    No Known Allergies  Past Surgical History:  Procedure Laterality Date  . none    . WISDOM TOOTH EXTRACTION  01/2019    Social History   Tobacco Use  . Smoking status: Never Smoker  .  Smokeless tobacco: Never Used  Vaping Use  . Vaping Use: Never used  Substance Use Topics  . Alcohol use: No  . Drug use: No     Medication list has been reviewed and updated.  Current Meds  Medication Sig  . dapagliflozin propanediol (FARXIGA) 10 MG TABS tablet Take 10 mg by mouth daily before breakfast.  . hydrochlorothiazide (HYDRODIURIL) 25 MG tablet TAKE 1 TABLET DAILY  . lisinopril (ZESTRIL) 20 MG tablet TAKE 1 TABLET DAILY  . omeprazole (PRILOSEC) 20 MG capsule Take 20 mg by mouth daily as needed.   . sitaGLIPtin-metformin (JANUMET) 50-1000 MG tablet Take 1 tablet by mouth 2 (two) times daily with a meal.  . Vitamin D, Ergocalciferol, (DRISDOL) 1.25 MG (50000 UNIT) CAPS capsule Take 1 capsule (50,000 Units total) by mouth every 7 (seven) days.    PHQ 2/9 Scores 07/25/2020 04/14/2020 03/31/2020 12/01/2019  PHQ - 2 Score 0 1 0 0  PHQ- 9 Score 0 6 0 0    GAD 7 : Generalized Anxiety Score 07/25/2020 03/31/2020 12/01/2019  Nervous, Anxious, on Edge 0 0 0  Control/stop worrying 0 0 0  Worry too much - different things 0 0 0  Trouble relaxing 0 0 0  Restless 0 0 0  Easily annoyed or irritable 0 0 0  Afraid - awful might happen 0 0 0  Total GAD 7 Score 0 0 0  Anxiety Difficulty Not difficult at all Not difficult at all Not difficult at all    BP Readings from Last 3 Encounters:  07/25/20 130/84  06/21/20 115/78  05/11/20 117/75    Physical Exam Vitals and nursing note reviewed.  Constitutional:      Appearance: Normal appearance. He is well-developed.  HENT:     Head: Normocephalic.     Right Ear: Tympanic membrane, ear canal and external ear normal.     Left Ear: Tympanic membrane, ear canal and external ear normal.     Nose: Nose normal.     Mouth/Throat:     Pharynx: Uvula midline.  Eyes:     Conjunctiva/sclera: Conjunctivae normal.     Pupils: Pupils are equal, round, and reactive to light.  Neck:     Thyroid: No thyromegaly.     Vascular: No carotid bruit.    Cardiovascular:     Rate and Rhythm: Normal rate and regular rhythm.     Heart sounds: Normal heart sounds.  Pulmonary:     Effort: Pulmonary effort is normal.     Breath sounds: Normal breath sounds. No wheezing.  Chest:     Breasts:        Right: No mass.        Left: No mass.  Abdominal:     General: Bowel sounds are normal.     Palpations: Abdomen is soft.     Tenderness: There is no abdominal tenderness.  Musculoskeletal:        General: Normal range of motion.     Cervical back: Normal range of motion and neck supple.  Lymphadenopathy:     Cervical: No cervical adenopathy.  Skin:    General: Skin is warm  and dry.  Neurological:     Mental Status: He is alert and oriented to person, place, and time.     Deep Tendon Reflexes: Reflexes are normal and symmetric.  Psychiatric:        Speech: Speech normal.        Behavior: Behavior normal.        Thought Content: Thought content normal.        Judgment: Judgment normal.     Wt Readings from Last 3 Encounters:  07/25/20 (!) 357 lb (161.9 kg)  06/21/20 (!) 345 lb (156.5 kg)  05/11/20 (!) 360 lb (163.3 kg)    BP 130/84   Pulse 73   Temp 98.1 F (36.7 C) (Oral)   Ht 5\' 5"  (1.651 m)   Wt (!) 357 lb (161.9 kg)   SpO2 98%   BMI 59.41 kg/m   Assessment and Plan: 1. Annual physical exam Exam is normal except for weight. Encourage him to continue with regular exercise and appropriate dietary changes with the support of [facility]. - POCT urinalysis dipstick  2. Need for hepatitis C screening test - Hepatitis C antibody  3. Essential hypertension Clinically stable exam with well controlled BP on lisinopril. Tolerating medications without side effects at this time. Pt to continue current regimen and low sodium diet; benefits of regular exercise as able discussed. - CBC with Differential/Platelet  4. Type II diabetes mellitus with complication (HCC) Clinically stable by exam and report without s/s  of hypoglycemia. DM complicated by HTN. Tolerating medications well without side effects or other concerns. - Comprehensive metabolic panel - Hemoglobin A1c - Lipid panel - TSH - dapagliflozin propanediol (FARXIGA) 10 MG TABS tablet; Take 1 tablet (10 mg total) by mouth daily before breakfast.  Dispense: 90 tablet; Refill: 3  5. Class 3 severe obesity with serious comorbidity and body mass index (BMI) of 50.0 to 59.9 in adult, unspecified obesity type (HCC) Working on steady weight loss with diet changes and exercise  6. Need for immunization against influenza - Flu Vaccine QUAD 36+ mos IM   Partially dictated using . Any errors are unintentional.  Animal nutritionist, MD [facility]  07/25/2020

## 2020-07-25 ENCOUNTER — Encounter: Payer: Self-pay | Admitting: Internal Medicine

## 2020-07-25 ENCOUNTER — Other Ambulatory Visit: Payer: Self-pay

## 2020-07-25 ENCOUNTER — Ambulatory Visit (INDEPENDENT_AMBULATORY_CARE_PROVIDER_SITE_OTHER): Payer: 59 | Admitting: Internal Medicine

## 2020-07-25 VITALS — BP 130/84 | HR 73 | Temp 98.1°F | Ht 65.0 in | Wt 357.0 lb

## 2020-07-25 DIAGNOSIS — Z1159 Encounter for screening for other viral diseases: Secondary | ICD-10-CM | POA: Diagnosis not present

## 2020-07-25 DIAGNOSIS — I10 Essential (primary) hypertension: Secondary | ICD-10-CM | POA: Diagnosis not present

## 2020-07-25 DIAGNOSIS — Z23 Encounter for immunization: Secondary | ICD-10-CM

## 2020-07-25 DIAGNOSIS — Z0000 Encounter for general adult medical examination without abnormal findings: Secondary | ICD-10-CM | POA: Diagnosis not present

## 2020-07-25 DIAGNOSIS — E118 Type 2 diabetes mellitus with unspecified complications: Secondary | ICD-10-CM | POA: Diagnosis not present

## 2020-07-25 DIAGNOSIS — Z6841 Body Mass Index (BMI) 40.0 and over, adult: Secondary | ICD-10-CM

## 2020-07-25 LAB — POCT URINALYSIS DIPSTICK
Bilirubin, UA: NEGATIVE
Blood, UA: 6
Glucose, UA: NEGATIVE
Ketones, UA: NEGATIVE
Leukocytes, UA: NEGATIVE
Nitrite, UA: NEGATIVE
Protein, UA: POSITIVE — AB
Spec Grav, UA: 1.01 (ref 1.010–1.025)
Urobilinogen, UA: 0.2 E.U./dL
pH, UA: 6 (ref 5.0–8.0)

## 2020-07-25 MED ORDER — DAPAGLIFLOZIN PROPANEDIOL 10 MG PO TABS: 10.0000 mg | ORAL_TABLET | Freq: Every day | ORAL | 3 refills | Status: DC

## 2020-07-26 LAB — CBC WITH DIFFERENTIAL/PLATELET
Basophils Absolute: 0 10*3/uL (ref 0.0–0.2)
Basos: 0 %
EOS (ABSOLUTE): 0.1 10*3/uL (ref 0.0–0.4)
Eos: 2 %
Hematocrit: 43.7 % (ref 37.5–51.0)
Hemoglobin: 13.8 g/dL (ref 13.0–17.7)
Immature Grans (Abs): 0 10*3/uL (ref 0.0–0.1)
Immature Granulocytes: 0 %
Lymphocytes Absolute: 2.6 10*3/uL (ref 0.7–3.1)
Lymphs: 36 %
MCH: 25.4 pg — ABNORMAL LOW (ref 26.6–33.0)
MCHC: 31.6 g/dL (ref 31.5–35.7)
MCV: 81 fL (ref 79–97)
Monocytes Absolute: 0.5 10*3/uL (ref 0.1–0.9)
Monocytes: 6 %
Neutrophils Absolute: 4 10*3/uL (ref 1.4–7.0)
Neutrophils: 56 %
Platelets: 356 10*3/uL (ref 150–450)
RBC: 5.43 x10E6/uL (ref 4.14–5.80)
RDW: 14.8 % (ref 11.6–15.4)
WBC: 7.2 10*3/uL (ref 3.4–10.8)

## 2020-07-26 LAB — LIPID PANEL
Chol/HDL Ratio: 3.1 ratio (ref 0.0–5.0)
Cholesterol, Total: 123 mg/dL (ref 100–199)
HDL: 40 mg/dL (ref >39)
LDL Chol Calc (NIH): 69 mg/dL (ref 0–99)
Triglycerides: 69 mg/dL (ref 0–149)
VLDL Cholesterol Cal: 14 mg/dL (ref 5–40)

## 2020-07-26 LAB — HEMOGLOBIN A1C
Est. average glucose Bld gHb Est-mCnc: 146 mg/dL
Hgb A1c MFr Bld: 6.7 % — ABNORMAL HIGH (ref 4.8–5.6)

## 2020-07-26 LAB — COMPREHENSIVE METABOLIC PANEL
ALT: 19 IU/L (ref 0–44)
AST: 25 IU/L (ref 0–40)
Albumin/Globulin Ratio: 1.2 (ref 1.2–2.2)
Albumin: 3.9 g/dL — ABNORMAL LOW (ref 4.1–5.2)
Alkaline Phosphatase: 68 IU/L (ref 44–121)
BUN/Creatinine Ratio: 9 (ref 9–20)
BUN: 10 mg/dL (ref 6–20)
Bilirubin Total: 0.4 mg/dL (ref 0.0–1.2)
CO2: 26 mmol/L (ref 20–29)
Calcium: 9.7 mg/dL (ref 8.7–10.2)
Chloride: 100 mmol/L (ref 96–106)
Creatinine, Ser: 1.1 mg/dL (ref 0.76–1.27)
GFR calc Af Amer: 104 mL/min/{1.73_m2} (ref >59)
GFR calc non Af Amer: 90 mL/min/{1.73_m2} (ref >59)
Globulin, Total: 3.3 g/dL (ref 1.5–4.5)
Glucose: 95 mg/dL (ref 65–99)
Potassium: 4.1 mmol/L (ref 3.5–5.2)
Sodium: 138 mmol/L (ref 134–144)
Total Protein: 7.2 g/dL (ref 6.0–8.5)

## 2020-07-26 LAB — TSH: TSH: 1.7 u[IU]/mL (ref 0.450–4.500)

## 2020-07-26 LAB — HEPATITIS C ANTIBODY: Hep C Virus Ab: 0.1 s/co ratio (ref 0.0–0.9)

## 2020-08-01 ENCOUNTER — Telehealth (INDEPENDENT_AMBULATORY_CARE_PROVIDER_SITE_OTHER): Payer: 59 | Admitting: Psychology

## 2020-08-01 DIAGNOSIS — F5089 Other specified eating disorder: Secondary | ICD-10-CM

## 2020-08-02 NOTE — Progress Notes (Signed)
  Office: [phone]  /  Fax: [phone]    Date: August 16, 2020   Appointment Start Time: 2:00pm Duration: 22 minutes Provider: Lawerance Cruel, Psy.D. Type of Session: Individual Therapy  Location of Patient: Home Location of Provider: Provider's Home Type of Contact: Telepsychological Visit via MyChart Video Visit  Session Content: Elijah Wells is a 29 y.o. male presenting for a follow-up appointment to address the previously established treatment goal of increasing coping skills. Today's appointment was a telepsychological visit due to COVID-19. Elijah Wells provided verbal consent for today's telepsychological appointment and he is aware he is responsible for securing confidentiality on his end of the session. Prior to proceeding with today's appointment, Elijah Wells's physical location at the time of this appointment was obtained as well a phone number he could be reached at in the event of technical difficulties. Elijah Wells and this provider participated in today's telepsychological service.   This provider conducted a brief check-in. Elijah Wells shared about his recent trip to [facility]. He described making better choices and engaging in portion control while on vacation. Positive reinforcement was provided. It was recommended he call the clinic to schedule a follow-up appointment with Elijah Hamburger, NP, as his last appointment was canceled; he agreed.   [name] expressed concern about the upcoming holidays. Thus, psychoeducation regarding making better choices and engaging in portion control during the holidays/celebrations was provided. More specifically, this provider discussed the following strategies: coming to meals hungry, but not starving; avoid filling up on appetizers; managing portion sizes; not completely depriving yourself; making the plate colorful (e.g., vegetables); pacing yourself (e.g., waiting 10 minutes before going back for seconds); taking advantage of the nutritious foods; practicing mindfulness;  staying hydrated; and avoid bringing home leftovers. Overall, Elijah Wells was receptive to today's appointment as evidenced by openness to sharing, responsiveness to feedback, and willingness to implement discussed strategies .  Mental Status Examination:  Appearance: well groomed and appropriate hygiene  Behavior: appropriate to circumstances Mood: euthymic Affect: mood congruent Speech: normal in rate, volume, and tone Eye Contact: appropriate Psychomotor Activity: appropriate Gait: unable to assess Thought Process: linear, logical, and goal directed  Thought Content/Perception: no hallucinations, delusions, bizarre thinking or behavior reported or observed and no evidence of suicidal and homicidal ideation, plan, and intent Orientation: time, person, place, and purpose of appointment Memory/Concentration: memory, attention, language, and fund of knowledge intact  Insight/Judgment: good  Interventions:  Conducted a brief chart review Provided empathic reflections and validation Provided positive reinforcement Employed supportive psychotherapy interventions to facilitate reduced distress and to improve coping skills with identified stressors Discussed strategies for holidays/celebrations  DSM-5 Diagnosis(es): 307.59 (F50.8) Other Specified Feeding or Eating Disorder, Emotional Eating Behaviors  Treatment Goal & Progress: During the initial appointment with this provider, the following treatment goal was established: increase coping skills. Elijah Wells has demonstrated progress in his goal as evidenced by increased awareness of hunger patterns, increased awareness of triggers for emotional eating and reduction in emotional eating. Elijah Wells also continues to demonstrate willingness to engage in learned skill(s).  Plan: The next appointment will be scheduled in two weeks, which will be via MyChart Video Visit. The next session will focus on working towards the established treatment goal and  termination.

## 2020-08-16 ENCOUNTER — Telehealth (INDEPENDENT_AMBULATORY_CARE_PROVIDER_SITE_OTHER): Payer: 59 | Admitting: Psychology

## 2020-08-16 DIAGNOSIS — F5089 Other specified eating disorder: Secondary | ICD-10-CM | POA: Diagnosis not present

## 2020-08-16 NOTE — Progress Notes (Signed)
  Office: [phone]  /  Fax: [phone]    Date: August 30, 2020   Appointment Start Time: 2:30pm Duration: 25 minutes Provider: Lawerance Cruel, Psy.D. Type of Session: Individual Therapy  Location of Patient: Parked in car in parking lot of [employer] ([place], [place]) Location of Provider: Provider's Home Type of Contact: Telepsychological Visit via MyChart Video Visit  Session Content: Nyzir is a 29 y.o. male presenting for a follow-up appointment to address the previously established treatment goal of increasing coping skills. Today's appointment was a telepsychological visit due to COVID-19. [name] provided verbal consent for today's telepsychological appointment and he is aware he is responsible for securing confidentiality on his end of the session. Prior to proceeding with today's appointment, Salik's physical location at the time of this appointment was obtained as well a phone number he could be reached at in the event of technical difficulties. [name] and this provider participated in today's telepsychological service.   This provider conducted a brief check-in. Tierra shared he is "trying to get back on plan" since his recent vacation. He stated he shared learned strategies for the holidays with his mother, adding, "She's supportive." He also continues to report a reduction in emotional eating. Positive reinforcement was provided. It was recommended [name] call the clinic to reschedule his canceled appointment with [name], NP; he agreed. Additionally, a plan was developed to help Osby cope with emotional eating in the future using learned skills. He wrote down the following plan: focus on hydration; be prepared with snacks congruent to the meal plan; pause to ask questions when experiencing cravings/urges (e.g., Am I really hungry?, Is there something bothering me?, and Will I feel better if I eat?); and engage in discussed coping strategies after going through the aforementioned  questions. Overall, Urian was receptive to today's appointment as evidenced by openness to sharing, responsiveness to feedback, and willingness to continue engaging in learned skills.  Mental Status Examination:  Appearance: well groomed and appropriate hygiene  Behavior: appropriate to circumstances Mood: euthymic Affect: mood congruent Speech: normal in rate, volume, and tone Eye Contact: appropriate Psychomotor Activity: appropriate Gait: unable to assess Thought Process: linear, logical, and goal directed  Thought Content/Perception: no hallucinations, delusions, bizarre thinking or behavior reported or observed and no evidence of suicidal and homicidal ideation, plan, and intent Orientation: time, person, place, and purpose of appointment Memory/Concentration: memory, attention, language, and fund of knowledge intact  Insight/Judgment: good  Interventions:  Conducted a brief chart review Provided empathic reflections and validation Employed supportive psychotherapy interventions to facilitate reduced distress and to improve coping skills with identified stressors Reviewed learned skills  Provided positive reinforcement  DSM-5 Diagnosis(es): 307.59 (F50.8) Other Specified Feeding or Eating Disorder, Emotional Eating Behaviors  Treatment Goal & Progress: During the initial appointment with this provider, the following treatment goal was established: increase coping skills. Jaclyn demonstrated progress in his goal as evidenced by increased awareness of hunger patterns, increased awareness of triggers for emotional eating and reduction in emotional eating. Jais also continues to demonstrate willingness to engage in learned skill(s).  Plan: Today was Alucard's last appointment with this provider. He acknowledged understanding that he may request a follow-up appointment with this provider in the future (following this provider's maternity leave as previously discussed) as long as he is still  established with the clinic. No further follow-up planned by this provider.

## 2020-08-30 ENCOUNTER — Other Ambulatory Visit: Payer: Self-pay

## 2020-08-30 ENCOUNTER — Telehealth (INDEPENDENT_AMBULATORY_CARE_PROVIDER_SITE_OTHER): Payer: 59 | Admitting: Psychology

## 2020-08-30 DIAGNOSIS — F5089 Other specified eating disorder: Secondary | ICD-10-CM | POA: Diagnosis not present

## 2020-09-07 ENCOUNTER — Other Ambulatory Visit: Payer: Self-pay | Admitting: Internal Medicine

## 2020-09-07 ENCOUNTER — Telehealth: Payer: Self-pay

## 2020-09-07 DIAGNOSIS — I129 Hypertensive chronic kidney disease with stage 1 through stage 4 chronic kidney disease, or unspecified chronic kidney disease: Secondary | ICD-10-CM

## 2020-09-07 DIAGNOSIS — E1122 Type 2 diabetes mellitus with diabetic chronic kidney disease: Secondary | ICD-10-CM

## 2020-09-07 MED ORDER — JANUMET 50-1000 MG PO TABS: 1.0000 | ORAL_TABLET | Freq: Two times a day (BID) | ORAL | 1 refills | Status: DC

## 2020-09-07 NOTE — Telephone Encounter (Signed)
Called pt told him that his meds was sent to [facility]. Pt verbalized understanding.  KP

## 2020-09-07 NOTE — Telephone Encounter (Signed)
Rx sent to [facility]

## 2020-09-07 NOTE — Telephone Encounter (Signed)
Copied from CRM [id number]. Topic: General - Call Back - No Documentation >> Sep 07, 2020 12:01 PM Randol Kern wrote: Reason for CRM: Pt wants to speak to PCP about a local Rx for Janumet, instead of mail order. only has 3 days left.   [facility] [address] [place] - [address] [place] [address] Phone: [phone] Fax: [phone]  Best contact: [phone]

## 2020-09-07 NOTE — Telephone Encounter (Signed)
Please Advise. Pt only has 3 days of medication left.  KP

## 2020-10-31 ENCOUNTER — Ambulatory Visit: Payer: 59 | Admitting: Internal Medicine

## 2020-12-02 ENCOUNTER — Other Ambulatory Visit: Payer: Self-pay | Admitting: Internal Medicine

## 2021-01-11 ENCOUNTER — Encounter: Payer: Self-pay | Admitting: Internal Medicine

## 2021-01-11 ENCOUNTER — Other Ambulatory Visit: Payer: Self-pay

## 2021-01-11 ENCOUNTER — Ambulatory Visit: Payer: 59 | Admitting: Internal Medicine

## 2021-01-11 VITALS — BP 134/76 | HR 80 | Ht 65.0 in | Wt 363.0 lb

## 2021-01-11 DIAGNOSIS — E66813 Obesity, class 3: Secondary | ICD-10-CM

## 2021-01-11 DIAGNOSIS — L84 Corns and callosities: Secondary | ICD-10-CM

## 2021-01-11 DIAGNOSIS — E1122 Type 2 diabetes mellitus with diabetic chronic kidney disease: Secondary | ICD-10-CM

## 2021-01-11 DIAGNOSIS — I10 Essential (primary) hypertension: Secondary | ICD-10-CM | POA: Diagnosis not present

## 2021-01-11 DIAGNOSIS — E118 Type 2 diabetes mellitus with unspecified complications: Secondary | ICD-10-CM | POA: Diagnosis not present

## 2021-01-11 DIAGNOSIS — Z6841 Body Mass Index (BMI) 40.0 and over, adult: Secondary | ICD-10-CM

## 2021-01-11 DIAGNOSIS — E559 Vitamin D deficiency, unspecified: Secondary | ICD-10-CM

## 2021-01-11 DIAGNOSIS — N181 Chronic kidney disease, stage 1: Secondary | ICD-10-CM

## 2021-01-11 DIAGNOSIS — I129 Hypertensive chronic kidney disease with stage 1 through stage 4 chronic kidney disease, or unspecified chronic kidney disease: Secondary | ICD-10-CM

## 2021-01-11 MED ORDER — JANUMET 50-1000 MG PO TABS: 1.0000 | ORAL_TABLET | Freq: Two times a day (BID) | ORAL | 1 refills | Status: DC

## 2021-01-11 MED ORDER — VITAMIN D (ERGOCALCIFEROL) 1.25 MG (50000 UNIT) PO CAPS: 50000.0000 [IU] | ORAL_CAPSULE | ORAL | 3 refills | Status: DC

## 2021-01-11 NOTE — Progress Notes (Signed)
Date:  01/11/2021   Name:  Elijah Wells   DOB:  1990-10-29   MRN:  [id number]   Chief Complaint: Diabetes  Diabetes He presents for his follow-up diabetic visit. He has type 2 diabetes mellitus. His disease course has been stable. Pertinent negatives for hypoglycemia include no headaches or tremors. Pertinent negatives for diabetes include no chest pain, no fatigue, no polydipsia and no polyuria. Current diabetic treatment includes oral agent (triple therapy) (janumet and farxiga). He is compliant with treatment all of the time. He is following a generally healthy diet. An ACE inhibitor/angiotensin II receptor blocker is being taken.  Hypertension This is a chronic problem. The problem is controlled. Pertinent negatives include no chest pain, headaches, palpitations or shortness of breath. Past treatments include diuretics and ACE inhibitors. The current treatment provides significant improvement.  Vitamin D def - now on weekly high dose supplement but looks like it was not refilled. He only took it for one month. Obesity - previously seeing Dr. Dewaine Conger for eating disorder at the weight management center but has not had a visit since 08/2020.  Lab Results  Component Value Date   CREATININE 1.10 07/25/2020   BUN 10 07/25/2020   NA 138 07/25/2020   K 4.1 07/25/2020   CL 100 07/25/2020   CO2 26 07/25/2020   Lab Results  Component Value Date   CHOL 123 07/25/2020   HDL 40 07/25/2020   LDLCALC 69 07/25/2020   TRIG 69 07/25/2020   CHOLHDL 3.1 07/25/2020   Lab Results  Component Value Date   TSH 1.700 07/25/2020   Lab Results  Component Value Date   HGBA1C 6.7 (H) 07/25/2020   Lab Results  Component Value Date   WBC 7.2 07/25/2020   HGB 13.8 07/25/2020   HCT 43.7 07/25/2020   MCV 81 07/25/2020   PLT 356 07/25/2020   Lab Results  Component Value Date   ALT 19 07/25/2020   AST 25 07/25/2020   ALKPHOS 68 07/25/2020   BILITOT 0.4 07/25/2020   Last vitamin D Lab  Results  Component Value Date   VD25OH 23.6 (L) 04/14/2020    Review of Systems  Constitutional: Negative for appetite change, fatigue and unexpected weight change.  Eyes: Negative for visual disturbance.  Respiratory: Negative for cough, shortness of breath and wheezing.   Cardiovascular: Negative for chest pain, palpitations and leg swelling.  Gastrointestinal: Negative for abdominal pain and blood in stool.  Endocrine: Negative for polydipsia and polyuria.  Genitourinary: Negative for dysuria and hematuria.  Skin: Negative for color change and rash.  Neurological: Negative for tremors, numbness and headaches.  Psychiatric/Behavioral: Negative for dysphoric mood.    Patient Active Problem List   Diagnosis Date Noted  . Vitamin D deficiency 06/23/2020  . Class 3 severe obesity with serious comorbidity and body mass index (BMI) of 50.0 to 59.9 in adult (HCC) 06/23/2020  . Type II diabetes mellitus with complication (HCC) 09/20/2017  . Essential hypertension 09/20/2017  . Gastroesophageal reflux disease 09/20/2017    No Known Allergies  Past Surgical History:  Procedure Laterality Date  . none    . WISDOM TOOTH EXTRACTION  01/2019    Social History   Tobacco Use  . Smoking status: Never Smoker  . Smokeless tobacco: Never Used  Vaping Use  . Vaping Use: Never used  Substance Use Topics  . Alcohol use: No  . Drug use: No    Medication list has been reviewed and updated.  Current  Meds  Medication Sig  . dapagliflozin propanediol (FARXIGA) 10 MG TABS tablet Take 1 tablet (10 mg total) by mouth daily before breakfast.  . hydrochlorothiazide (HYDRODIURIL) 25 MG tablet TAKE 1 TABLET DAILY  . lisinopril (ZESTRIL) 20 MG tablet TAKE 1 TABLET DAILY  . omeprazole (PRILOSEC) 20 MG capsule Take 20 mg by mouth daily as needed.   . sitaGLIPtin-metformin (JANUMET) 50-1000 MG tablet Take 1 tablet by mouth 2 (two) times daily with a meal.    PHQ 2/9 Scores 01/11/2021 07/25/2020  04/14/2020 03/31/2020  PHQ - 2 Score 0 0 1 0  PHQ- 9 Score 0 0 6 0    GAD 7 : Generalized Anxiety Score 01/11/2021 07/25/2020 03/31/2020 12/01/2019  Nervous, Anxious, on Edge 0 0 0 0  Control/stop worrying 0 0 0 0  Worry too much - different things 0 0 0 0  Trouble relaxing 0 0 0 0  Restless 0 0 0 0  Easily annoyed or irritable 0 0 0 0  Afraid - awful might happen 0 0 0 0  Total GAD 7 Score 0 0 0 0  Anxiety Difficulty Not difficult at all Not difficult at all Not difficult at all Not difficult at all    BP Readings from Last 3 Encounters:  01/11/21 134/76  07/25/20 130/84  06/21/20 115/78    Physical Exam Vitals and nursing note reviewed.  Constitutional:      General: He is not in acute distress.    Appearance: He is well-developed.  HENT:     Head: Normocephalic and atraumatic.  Cardiovascular:     Rate and Rhythm: Normal rate and regular rhythm.     Pulses: Normal pulses.     Heart sounds: No murmur heard.   Pulmonary:     Effort: Pulmonary effort is normal. No respiratory distress.     Breath sounds: No wheezing or rhonchi.  Musculoskeletal:     Cervical back: Normal range of motion.     Right lower leg: No edema.     Left lower leg: No edema.       Feet:  Feet:     Comments: Very thick skin with some cracking Lymphadenopathy:     Cervical: No cervical adenopathy.  Skin:    General: Skin is warm and dry.     Findings: No rash.  Neurological:     Mental Status: He is alert and oriented to person, place, and time.  Psychiatric:        Attention and Perception: Attention normal.        Mood and Affect: Mood normal.        Behavior: Behavior normal.     Wt Readings from Last 3 Encounters:  01/11/21 (!) 363 lb (164.7 kg)  07/25/20 (!) 357 lb (161.9 kg)  06/21/20 (!) 345 lb (156.5 kg)    BP 134/76   Pulse 80   Ht 5\' 5"  (1.651 m)   Wt (!) 363 lb (164.7 kg)   SpO2 97%   BMI 60.41 kg/m   Assessment and Plan: 1. Type II diabetes mellitus with  complication (HCC) Clinically stable by exam and report without s/s of hypoglycemia. DM complicated by obesity. Schedule DM eye exam. Tolerating medications well without side effects or other concerns.  2. Essential hypertension Clinically stable exam with well controlled BP. Tolerating medications without side effects at this time. Pt to continue current regimen and low sodium diet; benefits of regular exercise as able discussed.  3. Vitamin D deficiency Resume  weekly high dose. - Vitamin D, Ergocalciferol, (DRISDOL) 1.25 MG (50000 UNIT) CAPS capsule; Take 1 capsule (50,000 Units total) by mouth every 7 (seven) days.  Dispense: 12 capsule; Refill: 3  4. Pre-ulcerative calluses Needs to see Podiatry - Ambulatory referral to Podiatry  5. 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  Dispense: 180 tablet; Refill: 1  6. Class 3 severe obesity with serious comorbidity and body mass index (BMI) of 50.0 to 59.9 in adult, unspecified obesity type [facility]) Recommend that he follow up with the Weight management clinic   Partially dictated using Dragon software. Any errors are unintentional.  Bari Edward, MD [facility]  01/11/2021

## 2021-01-11 NOTE — Patient Instructions (Signed)
Please schedule eye exam

## 2021-03-02 ENCOUNTER — Other Ambulatory Visit: Payer: Self-pay | Admitting: Internal Medicine

## 2021-04-25 ENCOUNTER — Ambulatory Visit: Payer: 59 | Admitting: Internal Medicine

## 2021-04-28 ENCOUNTER — Ambulatory Visit: Payer: 59 | Admitting: Internal Medicine

## 2021-04-28 NOTE — Progress Notes (Deleted)
Date:  04/28/2021   Name:  Elijah Wells   DOB:  18-Apr-1991   MRN:  [id number]   Chief Complaint: No chief complaint on file.  Diabetes Pertinent negatives for hypoglycemia include no headaches or tremors. Pertinent negatives for diabetes include no chest pain, no fatigue, no polydipsia and no polyuria.  Hypertension This is a chronic problem. The problem is controlled. Pertinent negatives include no chest pain, headaches, palpitations or shortness of breath.   Lab Results  Component Value Date   CREATININE 1.10 07/25/2020   BUN 10 07/25/2020   NA 138 07/25/2020   K 4.1 07/25/2020   CL 100 07/25/2020   CO2 26 07/25/2020   Lab Results  Component Value Date   CHOL 123 07/25/2020   HDL 40 07/25/2020   LDLCALC 69 07/25/2020   TRIG 69 07/25/2020   CHOLHDL 3.1 07/25/2020   Lab Results  Component Value Date   TSH 1.700 07/25/2020   Lab Results  Component Value Date   HGBA1C 6.7 (H) 07/25/2020   Lab Results  Component Value Date   WBC 7.2 07/25/2020   HGB 13.8 07/25/2020   HCT 43.7 07/25/2020   MCV 81 07/25/2020   PLT 356 07/25/2020   Lab Results  Component Value Date   ALT 19 07/25/2020   AST 25 07/25/2020   ALKPHOS 68 07/25/2020   BILITOT 0.4 07/25/2020     Review of Systems  Constitutional:  Negative for appetite change, fatigue and unexpected weight change.  Eyes:  Negative for visual disturbance.  Respiratory:  Negative for cough, shortness of breath and wheezing.   Cardiovascular:  Negative for chest pain, palpitations and leg swelling.  Gastrointestinal:  Negative for abdominal pain and blood in stool.  Endocrine: Negative for polydipsia and polyuria.  Genitourinary:  Negative for dysuria and hematuria.  Skin:  Negative for color change and rash.  Neurological:  Negative for tremors, numbness and headaches.  Psychiatric/Behavioral:  Negative for dysphoric mood.    Patient Active Problem List   Diagnosis Date Noted   Vitamin D deficiency  06/23/2020   Class 3 severe obesity with serious comorbidity and body mass index (BMI) of 50.0 to 59.9 in adult [facility]) 06/23/2020   Type II diabetes mellitus with complication (HCC) 09/20/2017   Essential hypertension 09/20/2017   Gastroesophageal reflux disease 09/20/2017    No Known Allergies  Past Surgical History:  Procedure Laterality Date   none     WISDOM TOOTH EXTRACTION  01/2019    Social History   Tobacco Use   Smoking status: Never   Smokeless tobacco: Never  Vaping Use   Vaping Use: Never used  Substance Use Topics   Alcohol use: No   Drug use: No     Medication list has been reviewed and updated.  No outpatient medications have been marked as taking for the 04/28/21 encounter (Appointment) with Reubin Milan, MD.    [facility] 2/9 Scores 01/11/2021 07/25/2020 04/14/2020 03/31/2020  PHQ - 2 Score 0 0 1 0  PHQ- 9 Score 0 0 6 0    GAD 7 : Generalized Anxiety Score 01/11/2021 07/25/2020 03/31/2020 12/01/2019  Nervous, Anxious, on Edge 0 0 0 0  Control/stop worrying 0 0 0 0  Worry too much - different things 0 0 0 0  Trouble relaxing 0 0 0 0  Restless 0 0 0 0  Easily annoyed or irritable 0 0 0 0  Afraid - awful might happen 0 0 0 0  Total  GAD 7 Score 0 0 0 0  Anxiety Difficulty Not difficult at all Not difficult at all Not difficult at all Not difficult at all    BP Readings from Last 3 Encounters:  01/11/21 134/76  07/25/20 130/84  06/21/20 115/78    Physical Exam Vitals and nursing note reviewed.  Constitutional:      General: He is not in acute distress.    Appearance: He is well-developed.  HENT:     Head: Normocephalic and atraumatic.  Pulmonary:     Effort: Pulmonary effort is normal. No respiratory distress.  Skin:    General: Skin is warm and dry.     Findings: No rash.  Neurological:     Mental Status: He is alert and oriented to person, place, and time.  Psychiatric:        Mood and Affect: Mood normal.        Behavior: Behavior normal.     Wt Readings from Last 3 Encounters:  01/11/21 (!) 363 lb (164.7 kg)  07/25/20 (!) 357 lb (161.9 kg)  06/21/20 (!) 345 lb (156.5 kg)    There were no vitals taken for this visit.  Assessment and Plan:

## 2021-05-31 ENCOUNTER — Other Ambulatory Visit: Payer: Self-pay | Admitting: Internal Medicine

## 2021-05-31 NOTE — Telephone Encounter (Signed)
Requested Prescriptions  Pending Prescriptions Disp Refills  . hydrochlorothiazide (HYDRODIURIL) 25 MG tablet [Pharmacy Med Name: HYDROCHLOROT TAB 25MG ] 90 tablet 0    Sig: TAKE 1 TABLET DAILY. MAKE  AN APPOINTMENT FOR FURTHER REFILLS.     Cardiovascular: Diuretics - Thiazide Passed - 05/31/2021  2:05 AM      Passed - Ca in normal range and within 360 days    Calcium  Date Value Ref Range Status  07/25/2020 9.7 8.7 - 10.2 mg/dL Final   Calcium, Total  Date Value Ref Range Status  10/22/2013 10.0 8.5 - 10.1 mg/dL Final         Passed - Cr in normal range and within 360 days    Creatinine  Date Value Ref Range Status  10/22/2013 1.45 (H) 0.60 - 1.30 mg/dL Final   Creatinine, Ser  Date Value Ref Range Status  07/25/2020 1.10 0.76 - 1.27 mg/dL Final         Passed - K in normal range and within 360 days    Potassium  Date Value Ref Range Status  07/25/2020 4.1 3.5 - 5.2 mmol/L Final  10/22/2013 4.2 3.5 - 5.1 mmol/L Final         Passed - Na in normal range and within 360 days    Sodium  Date Value Ref Range Status  07/25/2020 138 134 - 144 mmol/L Final  10/22/2013 129 (L) 136 - 145 mmol/L Final         Passed - Last BP in normal range    BP Readings from Last 1 Encounters:  01/11/21 134/76         Passed - Valid encounter within last 6 months    Recent Outpatient Visits          4 months ago Type II diabetes mellitus with complication [facility])   [facility], MD   10 months ago Annual physical exam   [facility], MD   1 year ago 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 lisinopril (ZESTRIL) 20 MG tablet [Pharmacy Med Name: LISINOPRIL TAB 20MG ] 90 tablet  0    Sig: TAKE 1 TABLET DAILY. MAKE  AN APPOINTMENT FOR FURTHER REFILLS.     Cardiovascular:  ACE Inhibitors Failed - 05/31/2021  2:05 AM      Failed - Cr in normal range and within 180 days    Creatinine  Date Value Ref Range Status  10/22/2013 1.45 (H) 0.60 - 1.30 mg/dL Final   Creatinine, Ser  Date Value Ref Range Status  07/25/2020 1.10 0.76 - 1.27 mg/dL Final         Failed - K in normal range and within 180 days    Potassium  Date Value Ref Range Status  07/25/2020 4.1 3.5 - 5.2 mmol/L Final  10/22/2013 4.2 3.5 - 5.1 mmol/L Final         Passed - Patient is not pregnant      Passed - Last BP in normal range    BP Readings from Last 1 Encounters:  01/11/21 134/76  Passed - Valid encounter within last 6 months    Recent Outpatient Visits          4 months ago Type II diabetes mellitus with complication [facility])   [facility] Reubin Milan, MD   10 months ago Annual physical exam   [facility] Reubin Milan, MD   1 year ago 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

## 2021-07-25 ENCOUNTER — Other Ambulatory Visit: Payer: Self-pay

## 2021-07-25 ENCOUNTER — Encounter: Payer: Self-pay | Admitting: Internal Medicine

## 2021-07-25 ENCOUNTER — Ambulatory Visit: Payer: 59 | Admitting: Internal Medicine

## 2021-07-25 VITALS — BP 136/78 | HR 73 | Ht 65.0 in | Wt 383.0 lb

## 2021-07-25 DIAGNOSIS — K219 Gastro-esophageal reflux disease without esophagitis: Secondary | ICD-10-CM | POA: Diagnosis not present

## 2021-07-25 DIAGNOSIS — E118 Type 2 diabetes mellitus with unspecified complications: Secondary | ICD-10-CM | POA: Diagnosis not present

## 2021-07-25 DIAGNOSIS — Z23 Encounter for immunization: Secondary | ICD-10-CM

## 2021-07-25 DIAGNOSIS — E1122 Type 2 diabetes mellitus with diabetic chronic kidney disease: Secondary | ICD-10-CM

## 2021-07-25 DIAGNOSIS — I129 Hypertensive chronic kidney disease with stage 1 through stage 4 chronic kidney disease, or unspecified chronic kidney disease: Secondary | ICD-10-CM

## 2021-07-25 DIAGNOSIS — I10 Essential (primary) hypertension: Secondary | ICD-10-CM

## 2021-07-25 DIAGNOSIS — N181 Chronic kidney disease, stage 1: Secondary | ICD-10-CM

## 2021-07-25 LAB — POCT URINALYSIS DIPSTICK
Bilirubin, UA: NEGATIVE
Blood, UA: NEGATIVE
Glucose, UA: NEGATIVE
Ketones, UA: NEGATIVE
Leukocytes, UA: NEGATIVE
Nitrite, UA: NEGATIVE
Protein, UA: POSITIVE — AB
Spec Grav, UA: 1.015 (ref 1.010–1.025)
Urobilinogen, UA: 0.2 E.U./dL
pH, UA: 6 (ref 5.0–8.0)

## 2021-07-25 MED ORDER — HYDROCHLOROTHIAZIDE 25 MG PO TABS: 25.0000 mg | ORAL_TABLET | Freq: Every day | ORAL | 1 refills | Status: DC

## 2021-07-25 MED ORDER — DAPAGLIFLOZIN PROPANEDIOL 10 MG PO TABS: 10.0000 mg | ORAL_TABLET | Freq: Every day | ORAL | 3 refills | Status: DC

## 2021-07-25 MED ORDER — LISINOPRIL 20 MG PO TABS: 20.0000 mg | ORAL_TABLET | Freq: Every day | ORAL | 1 refills | Status: DC

## 2021-07-25 MED ORDER — JANUMET 50-1000 MG PO TABS: 1.0000 | ORAL_TABLET | Freq: Two times a day (BID) | ORAL | 1 refills | Status: DC

## 2021-07-25 NOTE — Progress Notes (Signed)
Date:  07/25/2021   Name:  Elijah Wells   DOB:  January 31, 1991   MRN:  [id number]   Chief Complaint: Diabetes and Hypertension  Hypertension This is a chronic problem. The problem is controlled. Pertinent negatives include no chest pain, headaches, palpitations or shortness of breath. Past treatments include ACE inhibitors and diuretics. The current treatment provides significant improvement.  Diabetes He presents for his follow-up diabetic visit. He has type 2 diabetes mellitus. His disease course has been stable. Pertinent negatives for hypoglycemia include no dizziness, headaches or nervousness/anxiousness. Pertinent negatives for diabetes include no chest pain, no fatigue, no polydipsia, no polyuria and no weakness. Current diabetic treatment includes oral agent (triple therapy) (farxiga, januvia or metformin). He is compliant with treatment all of the time.  Gastroesophageal Reflux He reports no abdominal pain, no chest pain, no coughing, no dysphagia, no heartburn or no wheezing. This is a recurrent problem. The problem occurs rarely. Pertinent negatives include no fatigue. He has tried a PPI for the symptoms.   Lab Results  Component Value Date   CREATININE 1.10 07/25/2020   BUN 10 07/25/2020   NA 138 07/25/2020   K 4.1 07/25/2020   CL 100 07/25/2020   CO2 26 07/25/2020   Lab Results  Component Value Date   CHOL 123 07/25/2020   HDL 40 07/25/2020   LDLCALC 69 07/25/2020   TRIG 69 07/25/2020   CHOLHDL 3.1 07/25/2020   Lab Results  Component Value Date   TSH 1.700 07/25/2020   Lab Results  Component Value Date   HGBA1C 6.7 (H) 07/25/2020   Lab Results  Component Value Date   WBC 7.2 07/25/2020   HGB 13.8 07/25/2020   HCT 43.7 07/25/2020   MCV 81 07/25/2020   PLT 356 07/25/2020   Lab Results  Component Value Date   ALT 19 07/25/2020   AST 25 07/25/2020   ALKPHOS 68 07/25/2020   BILITOT 0.4 07/25/2020     Review of Systems  Constitutional:  Negative for  fatigue and unexpected weight change.  HENT:  Negative for trouble swallowing.   Eyes:  Negative for visual disturbance.  Respiratory:  Negative for cough, chest tightness, shortness of breath and wheezing.   Cardiovascular:  Negative for chest pain, palpitations and leg swelling.  Gastrointestinal:  Negative for abdominal pain, constipation, diarrhea, dysphagia and heartburn.  Endocrine: Negative for polydipsia and polyuria.  Musculoskeletal:  Negative for arthralgias and gait problem.  Skin:  Negative for color change and wound.  Neurological:  Negative for dizziness, weakness, light-headedness and headaches.  Psychiatric/Behavioral:  Negative for dysphoric mood and sleep disturbance. The patient is not nervous/anxious.    Patient Active Problem List   Diagnosis Date Noted   Vitamin D deficiency 06/23/2020   Class 3 severe obesity with serious comorbidity and body mass index (BMI) of 50.0 to 59.9 in adult [facility]) 06/23/2020   Type II diabetes mellitus with complication (HCC) 09/20/2017   Essential hypertension 09/20/2017   Gastroesophageal reflux disease 09/20/2017    No Known Allergies  Past Surgical History:  Procedure Laterality Date   none     WISDOM TOOTH EXTRACTION  01/2019    Social History   Tobacco Use   Smoking status: Never   Smokeless tobacco: Never  Vaping Use   Vaping Use: Never used  Substance Use Topics   Alcohol use: No   Drug use: No     Medication list has been reviewed and updated.  Current Meds  Medication  Sig   Cholecalciferol (VITAMIN D3) 1.25 MG (50000 UT) CAPS Take 1 capsule by mouth once a week.   dapagliflozin propanediol (FARXIGA) 10 MG TABS tablet Take 1 tablet (10 mg total) by mouth daily before breakfast.   hydrochlorothiazide (HYDRODIURIL) 25 MG tablet TAKE 1 TABLET DAILY. MAKE  AN APPOINTMENT FOR FURTHER REFILLS.   lisinopril (ZESTRIL) 20 MG tablet TAKE 1 TABLET DAILY. MAKE  AN APPOINTMENT FOR FURTHER REFILLS.   omeprazole (PRILOSEC)  20 MG capsule Take 20 mg by mouth daily as needed.    sitaGLIPtin-metformin (JANUMET) 50-1000 MG tablet Take 1 tablet by mouth 2 (two) times daily with a meal.    PHQ 2/9 Scores 07/25/2021 01/11/2021 07/25/2020 04/14/2020  PHQ - 2 Score 0 0 0 1  PHQ- 9 Score 2 0 0 6    GAD 7 : Generalized Anxiety Score 07/25/2021 01/11/2021 07/25/2020 03/31/2020  Nervous, Anxious, on Edge 0 0 0 0  Control/stop worrying 0 0 0 0  Worry too much - different things 1 0 0 0  Trouble relaxing 0 0 0 0  Restless 0 0 0 0  Easily annoyed or irritable 0 0 0 0  Afraid - awful might happen 0 0 0 0  Total GAD 7 Score 1 0 0 0  Anxiety Difficulty Not difficult at all Not difficult at all Not difficult at all Not difficult at all    BP Readings from Last 3 Encounters:  07/25/21 136/78  01/11/21 134/76  07/25/20 130/84    Physical Exam Constitutional:      Appearance: He is obese.  Cardiovascular:     Rate and Rhythm: Normal rate and regular rhythm.     Pulses: Normal pulses.  Pulmonary:     Effort: Pulmonary effort is normal.     Breath sounds: No wheezing or rhonchi.  Musculoskeletal:     Cervical back: Normal range of motion.  Lymphadenopathy:     Cervical: No cervical adenopathy.  Skin:    General: Skin is warm and dry.     Capillary Refill: Capillary refill takes less than 2 seconds.  Neurological:     General: No focal deficit present.     Mental Status: He is alert.    Wt Readings from Last 3 Encounters:  07/25/21 (!) 383 lb (173.7 kg)  01/11/21 (!) 363 lb (164.7 kg)  07/25/20 (!) 357 lb (161.9 kg)    BP 136/78   Pulse 73   Ht 5\' 5"  (1.651 m)   Wt (!) 383 lb (173.7 kg)   SpO2 99%   BMI 63.73 kg/m   Assessment and Plan: 1. Essential hypertension Clinically stable exam with well controlled BP. Tolerating medications without side effects at this time. Pt to continue current regimen and low sodium diet; benefits of regular exercise as able discussed. - POCT urinalysis dipstick -  lisinopril (ZESTRIL) 20 MG tablet; Take 1 tablet (20 mg total) by mouth daily.  Dispense: 90 tablet; Refill: 1 - hydrochlorothiazide (HYDRODIURIL) 25 MG tablet; Take 1 tablet (25 mg total) by mouth daily.  Dispense: 90 tablet; Refill: 1  2. Type II diabetes mellitus with complication (HCC) Clinically stable by exam and report without s/s of hypoglycemia. DM complicated by hypertension and dyslipidemia. Tolerating medications well without side effects or other concerns. He will schedule a DM eye exam Resume structured diet plan for weight loss - Comprehensive metabolic panel - Hemoglobin A1c - Lipid panel - dapagliflozin propanediol (FARXIGA) 10 MG TABS tablet; Take 1 tablet (10 mg total)  by mouth daily before breakfast.  Dispense: 90 tablet; Refill: 3  3. Gastroesophageal reflux disease, unspecified whether esophagitis present Symptoms well controlled on PPI No red flag signs such as weight loss, n/v, melena Will continue PRN omeprazole.. - CBC with Differential/Platelet  4. 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  Dispense: 180 tablet; Refill: 1   Partially dictated using Animal nutritionist. Any errors are unintentional.  Bari Edward, MD [facility]  07/25/2021

## 2021-07-25 NOTE — Patient Instructions (Signed)
Schedule your Diabetic eye exam.  Resume the structured diet plan that you started last year to help with weight loss.

## 2021-07-26 LAB — CBC WITH DIFFERENTIAL/PLATELET
Basophils Absolute: 0 10*3/uL (ref 0.0–0.2)
Basos: 0 %
EOS (ABSOLUTE): 0.2 10*3/uL (ref 0.0–0.4)
Eos: 2 %
Hematocrit: 46.9 % (ref 37.5–51.0)
Hemoglobin: 15 g/dL (ref 13.0–17.7)
Immature Grans (Abs): 0 10*3/uL (ref 0.0–0.1)
Immature Granulocytes: 0 %
Lymphocytes Absolute: 3.1 10*3/uL (ref 0.7–3.1)
Lymphs: 39 %
MCH: 26 pg — ABNORMAL LOW (ref 26.6–33.0)
MCHC: 32 g/dL (ref 31.5–35.7)
MCV: 81 fL (ref 79–97)
Monocytes Absolute: 0.6 10*3/uL (ref 0.1–0.9)
Monocytes: 8 %
Neutrophils Absolute: 3.9 10*3/uL (ref 1.4–7.0)
Neutrophils: 51 %
Platelets: 329 10*3/uL (ref 150–450)
RBC: 5.78 x10E6/uL (ref 4.14–5.80)
RDW: 16.9 % — ABNORMAL HIGH (ref 11.6–15.4)
WBC: 7.7 10*3/uL (ref 3.4–10.8)

## 2021-07-26 LAB — COMPREHENSIVE METABOLIC PANEL
ALT: 64 IU/L — ABNORMAL HIGH (ref 0–44)
AST: 51 IU/L — ABNORMAL HIGH (ref 0–40)
Albumin/Globulin Ratio: 1.6 (ref 1.2–2.2)
Albumin: 4.7 g/dL (ref 4.1–5.2)
Alkaline Phosphatase: 85 IU/L (ref 44–121)
BUN/Creatinine Ratio: 10 (ref 9–20)
BUN: 10 mg/dL (ref 6–20)
Bilirubin Total: 0.5 mg/dL (ref 0.0–1.2)
CO2: 24 mmol/L (ref 20–29)
Calcium: 10.2 mg/dL (ref 8.7–10.2)
Chloride: 99 mmol/L (ref 96–106)
Creatinine, Ser: 1.03 mg/dL (ref 0.76–1.27)
Globulin, Total: 2.9 g/dL (ref 1.5–4.5)
Glucose: 144 mg/dL — ABNORMAL HIGH (ref 70–99)
Potassium: 4.4 mmol/L (ref 3.5–5.2)
Sodium: 140 mmol/L (ref 134–144)
Total Protein: 7.6 g/dL (ref 6.0–8.5)
eGFR: 100 mL/min/{1.73_m2} (ref >59)

## 2021-07-26 LAB — LIPID PANEL
Chol/HDL Ratio: 3.7 ratio (ref 0.0–5.0)
Cholesterol, Total: 130 mg/dL (ref 100–199)
HDL: 35 mg/dL — ABNORMAL LOW (ref >39)
LDL Chol Calc (NIH): 79 mg/dL (ref 0–99)
Triglycerides: 81 mg/dL (ref 0–149)
VLDL Cholesterol Cal: 16 mg/dL (ref 5–40)

## 2021-07-26 LAB — HEMOGLOBIN A1C
Est. average glucose Bld gHb Est-mCnc: 169 mg/dL
Hgb A1c MFr Bld: 7.5 % — ABNORMAL HIGH (ref 4.8–5.6)

## 2021-08-04 ENCOUNTER — Telehealth: Payer: Self-pay

## 2021-08-04 NOTE — Telephone Encounter (Signed)
Copied from CRM [id number]. Topic: General - Other >> Aug 04, 2021  2:37 PM Pawlus, Maxine Glenn A wrote: Reason for CRM: Pt called in to check on a PA that is required for sitaGLIPtin-metformin (JANUMET) 50-1000 MG tablet, pt wanted to know if this had been submitted.

## 2021-08-07 NOTE — Telephone Encounter (Signed)
Initiated PA on [email or web address].  Key: [id number]

## 2021-08-07 NOTE — Telephone Encounter (Signed)
Working on [place] today.

## 2021-08-10 ENCOUNTER — Telehealth: Payer: Self-pay

## 2021-08-10 NOTE — Telephone Encounter (Signed)
Completed PA on [email or web address] for JANUMET 50-1000 mg.  This request is approved from 08/09/2021 to 08/09/2022.  Patient informed.

## 2021-08-10 NOTE — Telephone Encounter (Signed)
This request is approved from 08/09/2021 to 08/09/2022.

## 2021-11-17 ENCOUNTER — Telehealth: Payer: Self-pay | Admitting: Internal Medicine

## 2021-11-24 ENCOUNTER — Ambulatory Visit: Payer: BC Managed Care – PPO | Admitting: Internal Medicine

## 2021-11-24 ENCOUNTER — Other Ambulatory Visit: Payer: Self-pay

## 2021-11-24 ENCOUNTER — Encounter: Payer: Self-pay | Admitting: Internal Medicine

## 2021-11-24 VITALS — BP 130/84 | HR 88 | Ht 65.0 in | Wt 392.0 lb

## 2021-11-24 DIAGNOSIS — E559 Vitamin D deficiency, unspecified: Secondary | ICD-10-CM

## 2021-11-24 DIAGNOSIS — I10 Essential (primary) hypertension: Secondary | ICD-10-CM

## 2021-11-24 DIAGNOSIS — E118 Type 2 diabetes mellitus with unspecified complications: Secondary | ICD-10-CM

## 2021-11-24 LAB — POCT GLYCOSYLATED HEMOGLOBIN (HGB A1C): Hemoglobin A1C: 7.8 % — AB (ref 4.0–5.6)

## 2021-11-24 MED ORDER — JANUMET 50-1000 MG PO TABS: 1.0000 | ORAL_TABLET | Freq: Two times a day (BID) | ORAL | 1 refills | Status: DC

## 2021-11-24 MED ORDER — VITAMIN D3 1.25 MG (50000 UT) PO CAPS: 1.0000 | ORAL_CAPSULE | ORAL | 1 refills | Status: DC

## 2021-11-24 MED ORDER — DAPAGLIFLOZIN PROPANEDIOL 10 MG PO TABS: 10.0000 mg | ORAL_TABLET | Freq: Every day | ORAL | 1 refills | Status: DC

## 2021-11-24 MED ORDER — LISINOPRIL 20 MG PO TABS: 20.0000 mg | ORAL_TABLET | Freq: Every day | ORAL | 1 refills | Status: DC

## 2021-11-24 MED ORDER — HYDROCHLOROTHIAZIDE 25 MG PO TABS: 25.0000 mg | ORAL_TABLET | Freq: Every day | ORAL | 1 refills | Status: DC

## 2021-11-24 NOTE — Progress Notes (Signed)
Date:  11/24/2021   Name:  Elijah Wells   DOB:  November 06, 1990   MRN:  [id number]   Chief Complaint: Diabetes and Hypertension  Diabetes He presents for his follow-up diabetic visit. He has type 2 diabetes mellitus. His disease course has been improving. Pertinent negatives for hypoglycemia include no headaches or tremors. Pertinent negatives for diabetes include no chest pain, no fatigue, no polydipsia and no polyuria. Pertinent negatives for diabetic complications include no CVA. Current diabetic treatment includes oral agent (triple therapy) (farxiga, januvia and metformin). His weight is stable. His breakfast blood glucose is taken between 6-7 am. His breakfast blood glucose range is generally 110-130 mg/dl. An ACE inhibitor/angiotensin II receptor blocker is being taken.  Hypertension This is a chronic problem. The problem is controlled. Pertinent negatives include no chest pain, headaches, palpitations or shortness of breath. Past treatments include ACE inhibitors and diuretics. There is no history of kidney disease, CAD/MI or CVA.   Lab Results  Component Value Date   NA 140 07/25/2021   K 4.4 07/25/2021   CO2 24 07/25/2021   GLUCOSE 144 (H) 07/25/2021   BUN 10 07/25/2021   CREATININE 1.03 07/25/2021   CALCIUM 10.2 07/25/2021   EGFR 100 07/25/2021   GFRNONAA 90 07/25/2020   Lab Results  Component Value Date   CHOL 130 07/25/2021   HDL 35 (L) 07/25/2021   LDLCALC 79 07/25/2021   TRIG 81 07/25/2021   CHOLHDL 3.7 07/25/2021   Lab Results  Component Value Date   TSH 1.700 07/25/2020   Lab Results  Component Value Date   HGBA1C 7.8 (A) 11/24/2021   Lab Results  Component Value Date   WBC 7.7 07/25/2021   HGB 15.0 07/25/2021   HCT 46.9 07/25/2021   MCV 81 07/25/2021   PLT 329 07/25/2021   Lab Results  Component Value Date   ALT 64 (H) 07/25/2021   AST 51 (H) 07/25/2021   ALKPHOS 85 07/25/2021   BILITOT 0.5 07/25/2021   Lab Results  Component Value Date    VD25OH 23.6 (L) 04/14/2020     Review of Systems  Constitutional:  Positive for unexpected weight change (has gained about 10 lbs since his last visit). Negative for appetite change and fatigue.  Eyes:  Negative for visual disturbance.  Respiratory:  Negative for cough, shortness of breath and wheezing.   Cardiovascular:  Negative for chest pain, palpitations and leg swelling.  Gastrointestinal:  Negative for abdominal pain and blood in stool.  Endocrine: Negative for polydipsia and polyuria.  Genitourinary:  Negative for dysuria and hematuria.  Skin:  Negative for color change and rash.  Neurological:  Negative for tremors, numbness and headaches.  Psychiatric/Behavioral:  Negative for dysphoric mood.    Patient Active Problem List   Diagnosis Date Noted   Vitamin D deficiency 06/23/2020   Class 3 severe obesity with serious comorbidity and body mass index (BMI) of 50.0 to 59.9 in adult [facility]) 06/23/2020   Type II diabetes mellitus with complication ([facility]) 43/60/1658   Essential hypertension 09/20/2017   Gastroesophageal reflux disease 09/20/2017    No Known Allergies  Past Surgical History:  Procedure Laterality Date   none     WISDOM TOOTH EXTRACTION  01/2019    Social History   Tobacco Use   Smoking status: Never   Smokeless tobacco: Never  Vaping Use   Vaping Use: Never used  Substance Use Topics   Alcohol use: No   Drug use: No  Medication list has been reviewed and updated.  Current Meds  Medication Sig   omeprazole (PRILOSEC) 20 MG capsule Take 20 mg by mouth daily as needed.    [DISCONTINUED] Cholecalciferol (VITAMIN D3) 1.25 MG (50000 UT) CAPS Take 1 capsule by mouth once a week.   [DISCONTINUED] dapagliflozin propanediol (FARXIGA) 10 MG TABS tablet Take 1 tablet (10 mg total) by mouth daily before breakfast.   [DISCONTINUED] hydrochlorothiazide (HYDRODIURIL) 25 MG tablet Take 1 tablet (25 mg total) by mouth daily.   [DISCONTINUED] lisinopril (ZESTRIL)  20 MG tablet Take 1 tablet (20 mg total) by mouth daily.   [DISCONTINUED] sitaGLIPtin-metformin (JANUMET) 50-1000 MG tablet Take 1 tablet by mouth 2 (two) times daily with a meal.    PHQ 2/9 Scores 11/24/2021 07/25/2021 01/11/2021 07/25/2020  PHQ - 2 Score 0 0 0 0  PHQ- 9 Score 3 2 0 0    GAD 7 : Generalized Anxiety Score 11/24/2021 07/25/2021 01/11/2021 07/25/2020  Nervous, Anxious, on Edge 0 0 0 0  Control/stop worrying 0 0 0 0  Worry too much - different things 0 1 0 0  Trouble relaxing 0 0 0 0  Restless 0 0 0 0  Easily annoyed or irritable 0 0 0 0  Afraid - awful might happen 0 0 0 0  Total GAD 7 Score 0 1 0 0  Anxiety Difficulty - Not difficult at all Not difficult at all Not difficult at all    BP Readings from Last 3 Encounters:  11/24/21 130/84  07/25/21 136/78  01/11/21 134/76    Physical Exam Vitals and nursing note reviewed.  Constitutional:      General: He is not in acute distress.    Appearance: He is well-developed. He is obese.  HENT:     Head: Normocephalic and atraumatic.  Cardiovascular:     Rate and Rhythm: Normal rate and regular rhythm.     Pulses: Normal pulses.  Pulmonary:     Effort: Pulmonary effort is normal. No respiratory distress.     Breath sounds: No wheezing or rhonchi.  Musculoskeletal:        General: Normal range of motion.     Cervical back: Normal range of motion.     Right lower leg: No edema.     Left lower leg: No edema.  Lymphadenopathy:     Cervical: No cervical adenopathy.  Skin:    General: Skin is warm and dry.     Capillary Refill: Capillary refill takes less than 2 seconds.     Findings: No rash.  Neurological:     General: No focal deficit present.     Mental Status: He is alert and oriented to person, place, and time.  Psychiatric:        Mood and Affect: Mood normal.        Behavior: Behavior normal.    Wt Readings from Last 3 Encounters:  11/24/21 (!) 392 lb (177.8 kg)  07/25/21 (!) 383 lb (173.7 kg)   01/11/21 (!) 363 lb (164.7 kg)    BP 130/84    Pulse 88    Ht _0  (1.651 m)    Wt (!) 392 lb (177.8 kg)    SpO2 98%    BMI 65.23 kg/m   Assessment and Plan: 1. Type II diabetes mellitus with complication (HCC) DM control continues to worsen due to dietary non- compliance.  He has lost significant weight in the past with structured diet changes.  He needs to resume these.  Consider Ozempic next visit if not improved - Microalbumin / creatinine urine ratio - POCT glycosylated hemoglobin (Hb A1C) - sitaGLIPtin-metformin (JANUMET) 50-1000 MG tablet; Take 1 tablet by mouth 2 (two) times daily with a meal.  Dispense: 180 tablet; Refill: 1 - dapagliflozin propanediol (FARXIGA) 10 MG TABS tablet; Take 1 tablet (10 mg total) by mouth daily before breakfast.  Dispense: 90 tablet; Refill: 1  2. Essential hypertension Clinically stable exam with well controlled BP. Tolerating medications without side effects at this time. Pt to continue current regimen and low sodium diet; benefits of regular exercise as able discussed. - hydrochlorothiazide (HYDRODIURIL) 25 MG tablet; Take 1 tablet (25 mg total) by mouth daily.  Dispense: 90 tablet; Refill: 1 - lisinopril (ZESTRIL) 20 MG tablet; Take 1 tablet (20 mg total) by mouth daily.  Dispense: 90 tablet; Refill: 1  3. Vitamin D deficiency Supplemented. - Cholecalciferol (VITAMIN D3) 1.25 MG (50000 UT) CAPS; Take 1 capsule by mouth once a week.  Dispense: 12 capsule; Refill: 1   Partially dictated using [employer]. Any errors are unintentional.  Halina Maidens, MD [facility]  11/24/2021

## 2021-11-28 ENCOUNTER — Ambulatory Visit: Payer: 59 | Admitting: Internal Medicine

## 2021-11-29 LAB — MITOCHONDRIAL ANTIBODIES: Mitochondrial Ab: <20 Units (ref 0.0–20.0)

## 2021-11-29 LAB — MICROALBUMIN / CREATININE URINE RATIO
Creatinine, Urine: 62.3 mg/dL
Microalb/Creat Ratio: 108 mg/g creat — ABNORMAL HIGH (ref 0–29)
Microalbumin, Urine: 67.5 ug/mL

## 2021-11-29 LAB — CERULOPLASMIN: Ceruloplasmin: 18.3 mg/dL (ref 16.0–31.0)

## 2021-11-29 LAB — HAV ANTIBODY W/ RFX: hep A Total Ab: NEGATIVE

## 2021-11-29 LAB — HEPATITIS B SURFACE ANTIBODY,QUALITATIVE: Hep B Surface Ab, Qual: NONREACTIVE

## 2021-11-29 LAB — ANA W/REFLEX: Anti Nuclear Antibody (ANA): NEGATIVE

## 2021-11-29 LAB — HEPATITIS B CORE ANTIBODY, TOTAL: Hep B Core Total Ab: NEGATIVE

## 2021-11-29 LAB — ALPHA-1-ANTITRYPSIN: A-1 Antitrypsin: 140 mg/dL (ref 95–164)

## 2021-11-29 LAB — ANTI-SMOOTH MUSCLE ANTIBODY, IGG: Smooth Muscle Ab: 3 Units (ref 0–19)

## 2021-12-15 LAB — HM DIABETES EYE EXAM

## 2022-01-04 ENCOUNTER — Encounter: Payer: Self-pay | Admitting: Internal Medicine

## 2022-01-29 ENCOUNTER — Encounter: Payer: Self-pay | Admitting: Internal Medicine

## 2022-01-29 DIAGNOSIS — R748 Abnormal levels of other serum enzymes: Secondary | ICD-10-CM | POA: Insufficient documentation

## 2022-03-26 ENCOUNTER — Ambulatory Visit: Payer: BC Managed Care – PPO | Admitting: Internal Medicine

## 2022-05-04 ENCOUNTER — Ambulatory Visit: Payer: BC Managed Care – PPO | Admitting: Internal Medicine

## 2022-05-04 ENCOUNTER — Encounter: Payer: Self-pay | Admitting: Internal Medicine

## 2022-05-04 VITALS — BP 126/70 | HR 99 | Ht 65.0 in | Wt 389.0 lb

## 2022-05-04 DIAGNOSIS — E118 Type 2 diabetes mellitus with unspecified complications: Secondary | ICD-10-CM

## 2022-05-04 DIAGNOSIS — Z6841 Body Mass Index (BMI) 40.0 and over, adult: Secondary | ICD-10-CM

## 2022-05-04 DIAGNOSIS — E559 Vitamin D deficiency, unspecified: Secondary | ICD-10-CM

## 2022-05-04 DIAGNOSIS — I10 Essential (primary) hypertension: Secondary | ICD-10-CM

## 2022-05-04 LAB — POCT GLYCOSYLATED HEMOGLOBIN (HGB A1C): Hemoglobin A1C: 8.3 % — AB (ref 4.0–5.6)

## 2022-05-04 MED ORDER — JANUMET 50-1000 MG PO TABS: 1.0000 | ORAL_TABLET | Freq: Two times a day (BID) | ORAL | 1 refills | Status: DC

## 2022-05-04 MED ORDER — VITAMIN D3 125 MCG (5000 UT) PO CAPS: 5000.0000 [IU] | ORAL_CAPSULE | Freq: Every day | ORAL | 0 refills | Status: DC

## 2022-05-04 MED ORDER — DAPAGLIFLOZIN PROPANEDIOL 10 MG PO TABS: 10.0000 mg | ORAL_TABLET | Freq: Every day | ORAL | 1 refills | Status: DC

## 2022-05-04 MED ORDER — VITAMIN D3 1.25 MG (50000 UT) PO CAPS: 1.0000 | ORAL_CAPSULE | ORAL | 1 refills | Status: DC

## 2022-05-04 MED ORDER — PEN NEEDLES 32G X 5 MM MISC
1.0000 | 0 refills | Status: AC
Start: 2022-05-04 — End: ?

## 2022-05-04 NOTE — Patient Instructions (Addendum)
In place of high dose vitamin D, take 5000 IU over the counter daily.  Ozempic 0.25 mg weekly for 4 weeks then  Ozempic 0.5 mg weekly for 2 weeks  Call for an Rx of Ozempic 1 mg weekly after your first dose of 0.5 mg.

## 2022-05-04 NOTE — Progress Notes (Signed)
Date:  05/04/2022   Name:  Elijah Wells   DOB:  [birth year]   MRN:  [id number]   Chief Complaint: Diabetes and Hypertension  Hypertension This is a chronic problem. The problem is controlled. Pertinent negatives include no chest pain, headaches, palpitations or shortness of breath. Past treatments include diuretics and ACE inhibitors. There is no history of kidney disease, CAD/MI or CVA.  Diabetes He presents for his follow-up diabetic visit. He has type 2 diabetes mellitus. Pertinent negatives for hypoglycemia include no headaches or tremors. Pertinent negatives for diabetes include no chest pain, no fatigue, no polydipsia and no polyuria. Pertinent negatives for diabetic complications include no CVA. Current diabetic treatments: janumet and farxiga. He is following a generally healthy diet. When asked about meal planning, he reported none.    Lab Results  Component Value Date   NA 140 07/25/2021   K 4.4 07/25/2021   CO2 24 07/25/2021   GLUCOSE 144 (H) 07/25/2021   BUN 10 07/25/2021   CREATININE 1.03 07/25/2021   CALCIUM 10.2 07/25/2021   EGFR 100 07/25/2021   GFRNONAA 90 07/25/2020   Lab Results  Component Value Date   CHOL 130 07/25/2021   HDL 35 (L) 07/25/2021   LDLCALC 79 07/25/2021   TRIG 81 07/25/2021   CHOLHDL 3.7 07/25/2021   Lab Results  Component Value Date   TSH 1.700 07/25/2020   Lab Results  Component Value Date   HGBA1C 8.3 (A) 05/04/2022   Lab Results  Component Value Date   WBC 7.7 07/25/2021   HGB 15.0 07/25/2021   HCT 46.9 07/25/2021   MCV 81 07/25/2021   PLT 329 07/25/2021   Lab Results  Component Value Date   ALT 64 (H) 07/25/2021   AST 51 (H) 07/25/2021   ALKPHOS 85 07/25/2021   BILITOT 0.5 07/25/2021   Lab Results  Component Value Date   VD25OH 23.6 (L) 04/14/2020     Review of Systems  Constitutional:  Positive for unexpected weight change (has lost 4 lbs since last visit). Negative for appetite change and fatigue.  Eyes:   Negative for visual disturbance.  Respiratory:  Negative for cough, shortness of breath and wheezing.   Cardiovascular:  Negative for chest pain, palpitations and leg swelling.  Gastrointestinal:  Negative for abdominal pain and blood in stool.  Endocrine: Negative for polydipsia and polyuria.  Genitourinary:  Negative for dysuria and hematuria.  Skin:  Negative for color change and rash.  Neurological:  Negative for tremors, numbness and headaches.  Psychiatric/Behavioral:  Negative for dysphoric mood.     Patient Active Problem List   Diagnosis Date Noted   Abnormal liver enzymes 01/29/2022   Vitamin D deficiency 06/23/2020   Class 3 severe obesity with serious comorbidity and body mass index (BMI) of 50.0 to 59.9 in adult [facility]) 06/23/2020   Type II diabetes mellitus with complication ([place]) 20/94/7096   Essential hypertension 09/20/2017   Gastroesophageal reflux disease 09/20/2017    No Known Allergies  Past Surgical History:  Procedure Laterality Date   none     WISDOM TOOTH EXTRACTION  01/2019    Social History   Tobacco Use   Smoking status: Never   Smokeless tobacco: Never  Vaping Use   Vaping Use: Never used  Substance Use Topics   Alcohol use: No   Drug use: No     Medication list has been reviewed and updated.  Current Meds  Medication Sig   hydrochlorothiazide (HYDRODIURIL) 25 MG tablet  Take 1 tablet (25 mg total) by mouth daily.   lisinopril (ZESTRIL) 20 MG tablet Take 1 tablet (20 mg total) by mouth daily.   omeprazole (PRILOSEC) 20 MG capsule Take 20 mg by mouth daily as needed.    [DISCONTINUED] dapagliflozin propanediol (FARXIGA) 10 MG TABS tablet Take 1 tablet (10 mg total) by mouth daily before breakfast.   [DISCONTINUED] sitaGLIPtin-metformin (JANUMET) 50-1000 MG tablet Take 1 tablet by mouth 2 (two) times daily with a meal.       05/04/2022    2:41 PM 11/24/2021    9:08 AM 07/25/2021   10:49 AM 01/11/2021    2:25 PM  GAD 7 : Generalized  Anxiety Score  Nervous, Anxious, on Edge 0 0 0 0  Control/stop worrying 0 0 0 0  Worry too much - different things 0 0 1 0  Trouble relaxing 0 0 0 0  Restless 0 0 0 0  Easily annoyed or irritable 0 0 0 0  Afraid - awful might happen 0 0 0 0  Total GAD 7 Score 0 0 1 0  Anxiety Difficulty Not difficult at all  Not difficult at all Not difficult at all       05/04/2022    2:41 PM 11/24/2021    9:07 AM 07/25/2021   10:49 AM  Depression screen PHQ 2/9  Decreased Interest 0 0 0  Down, Depressed, Hopeless 0 0 0  PHQ - 2 Score 0 0 0  Altered sleeping 1 0 0  Tired, decreased energy 1 1 1   Change in appetite 0 1 1  Feeling bad or failure about yourself  0 0 0  Trouble concentrating 0 1 0  Moving slowly or fidgety/restless 0 0 0  Suicidal thoughts 0 0 0  PHQ-9 Score 2 3 2   Difficult doing work/chores Not difficult at all Not difficult at all Not difficult at all    BP Readings from Last 3 Encounters:  05/04/22 126/70  11/24/21 130/84  07/25/21 136/78    Physical Exam Vitals and nursing note reviewed.  Constitutional:      General: He is not in acute distress.    Appearance: He is well-developed.  HENT:     Head: Normocephalic and atraumatic.  Cardiovascular:     Rate and Rhythm: Normal rate and regular rhythm.  Pulmonary:     Effort: Pulmonary effort is normal. No respiratory distress.     Breath sounds: No wheezing or rhonchi.  Musculoskeletal:     Cervical back: Normal range of motion.  Lymphadenopathy:     Cervical: No cervical adenopathy.  Skin:    General: Skin is warm and dry.     Findings: No rash.  Neurological:     Mental Status: He is alert and oriented to person, place, and time.  Psychiatric:        Mood and Affect: Mood normal.        Behavior: Behavior normal.     Wt Readings from Last 3 Encounters:  05/04/22 (!) 389 lb (176.4 kg)  11/24/21 (!) 392 lb (177.8 kg)  07/25/21 (!) 383 lb (173.7 kg)    BP 126/70 (BP Location: Right Arm, Cuff Size:  Large)   Pulse 99   Ht 5' 5"  (1.651 m)   Wt (!) 389 lb (176.4 kg)   SpO2 97%   BMI 64.73 kg/m   Assessment and Plan: 1. Type II diabetes mellitus with complication (HCC) BS not controlled with three medications. Will add Ozempic - starter  pack given and pt injected the first dose during the visit He will call for 1 mg Rx when he takes the fifth dose Consider stopping Januvia but continuing metformin if control improves - POCT glycosylated hemoglobin (Hb A1C)= 8.3 up from 7.8 - dapagliflozin propanediol (FARXIGA) 10 MG TABS tablet; Take 1 tablet (10 mg total) by mouth daily before breakfast.  Dispense: 90 tablet; Refill: 1 - sitaGLIPtin-metformin (JANUMET) 50-1000 MG tablet; Take 1 tablet by mouth 2 (two) times daily with a meal.  Dispense: 180 tablet; Refill: 1  2. Essential hypertension Clinically stable exam with well controlled BP. Tolerating medications without side effects at this time. Pt to continue current regimen and low sodium diet; benefits of regular exercise as able discussed.  3. Vitamin D deficiency High dose not covered - recommend 5000 IU daily otc - Cholecalciferol (VITAMIN D3) 1.25 MG (50000 UT) CAPS; Take 1 capsule by mouth once a week.  Dispense: 12 capsule; Refill: 1  4. Class 3 severe obesity with serious comorbidity and body mass index (BMI) of 50.0 to 59.9 in adult, unspecified obesity type ([place]) Continue to work on diet Hopefully will have some weight loss with Ozempic   Partially dictated using [employer]. Any errors are unintentional.  Halina Maidens, MD [facility]  05/04/2022

## 2022-05-23 ENCOUNTER — Encounter (INDEPENDENT_AMBULATORY_CARE_PROVIDER_SITE_OTHER): Payer: Self-pay

## 2022-06-08 ENCOUNTER — Encounter: Payer: Self-pay | Admitting: Internal Medicine

## 2022-06-10 ENCOUNTER — Other Ambulatory Visit: Payer: Self-pay | Admitting: Internal Medicine

## 2022-06-10 DIAGNOSIS — E118 Type 2 diabetes mellitus with unspecified complications: Secondary | ICD-10-CM

## 2022-06-15 ENCOUNTER — Other Ambulatory Visit: Payer: Self-pay | Admitting: Internal Medicine

## 2022-06-15 DIAGNOSIS — E118 Type 2 diabetes mellitus with unspecified complications: Secondary | ICD-10-CM

## 2022-06-15 MED ORDER — SEMAGLUTIDE (1 MG/DOSE) 4 MG/3ML ~~LOC~~ SOPN
1.0000 mg | PEN_INJECTOR | SUBCUTANEOUS | 1 refills | Status: DC
Start: 2022-06-15 — End: 2022-10-03

## 2022-07-10 IMAGING — CR DG SHOULDER 2+V*R*
4 series · 4 of 4 positions shown · non-contrast
Comparison: None.

CLINICAL DATA: 28-year-old male with fall and trauma to the right
shoulder.

EXAM:
RIGHT SHOULDER - 2+ VIEW

[shoulder grashey]
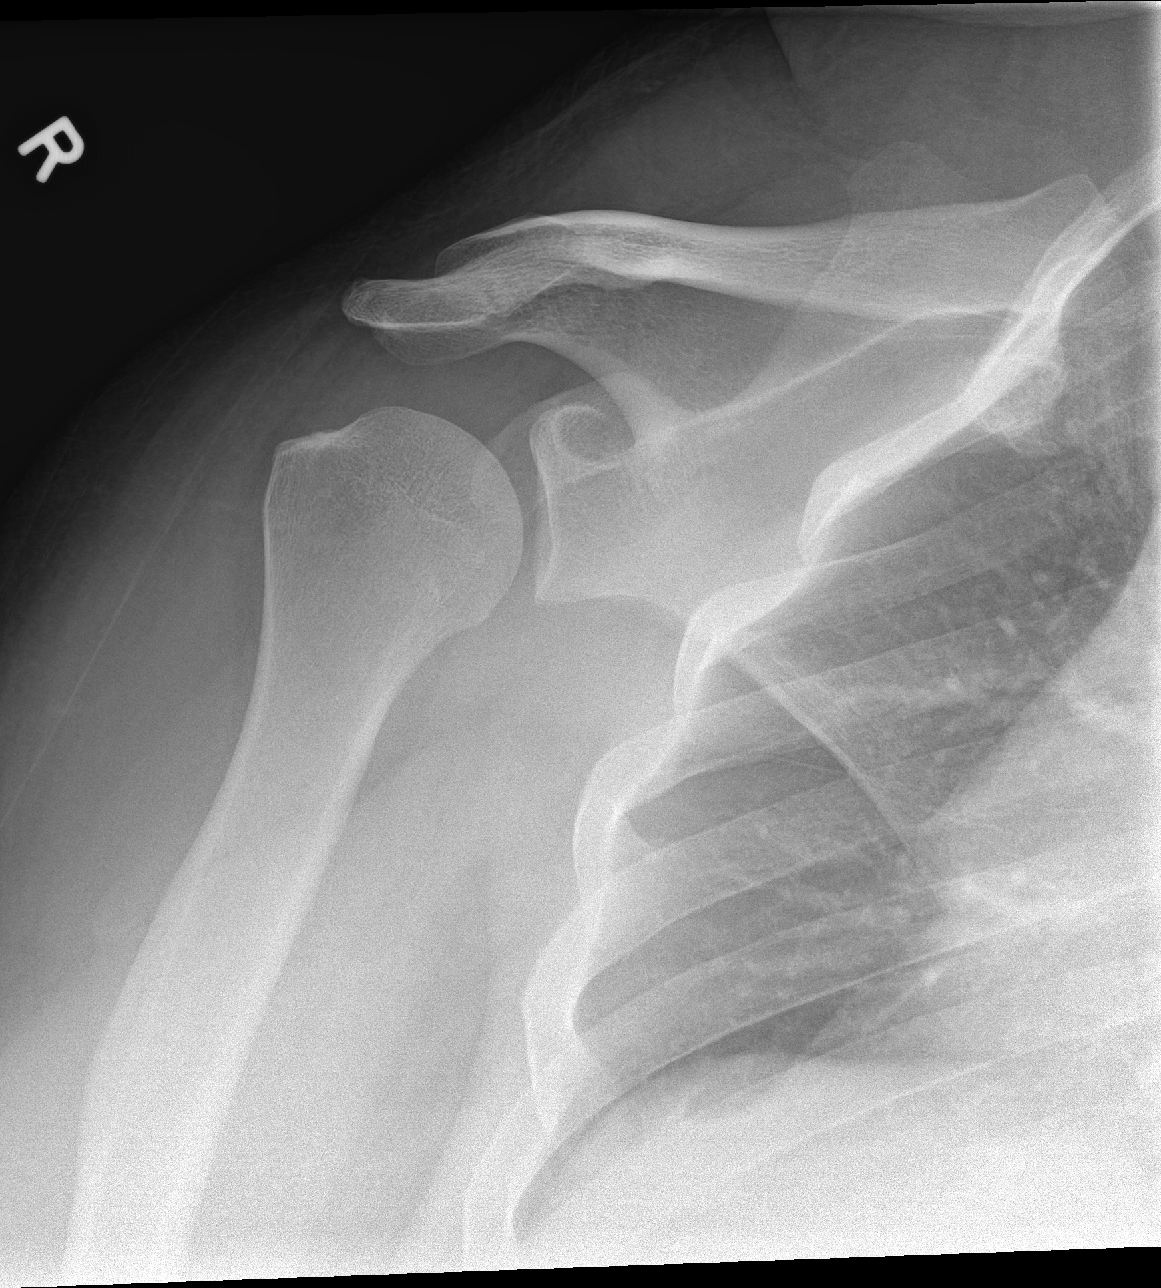

[shoulder y view]
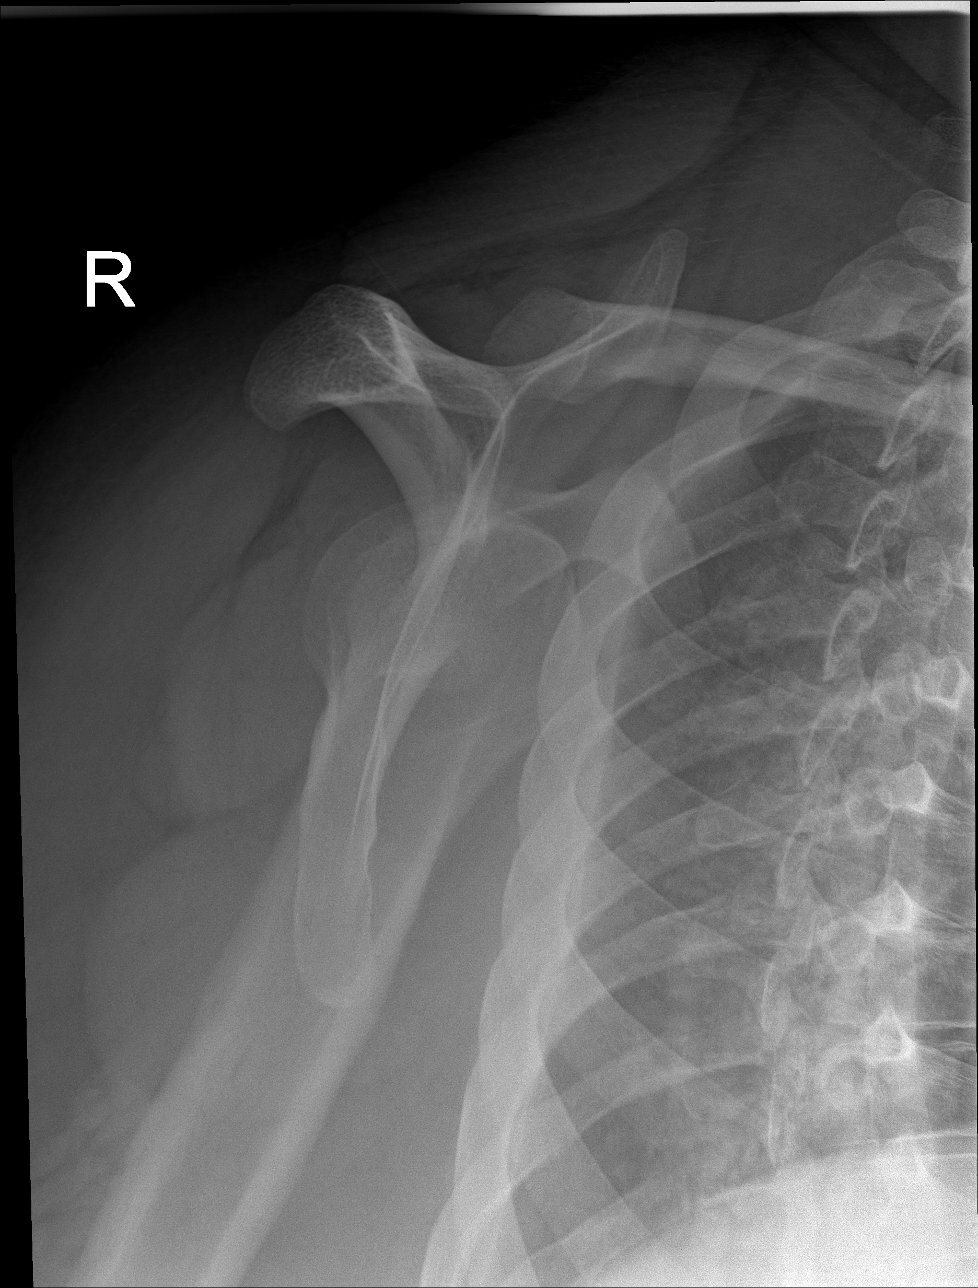

[shoulder axial (1 of 2)]
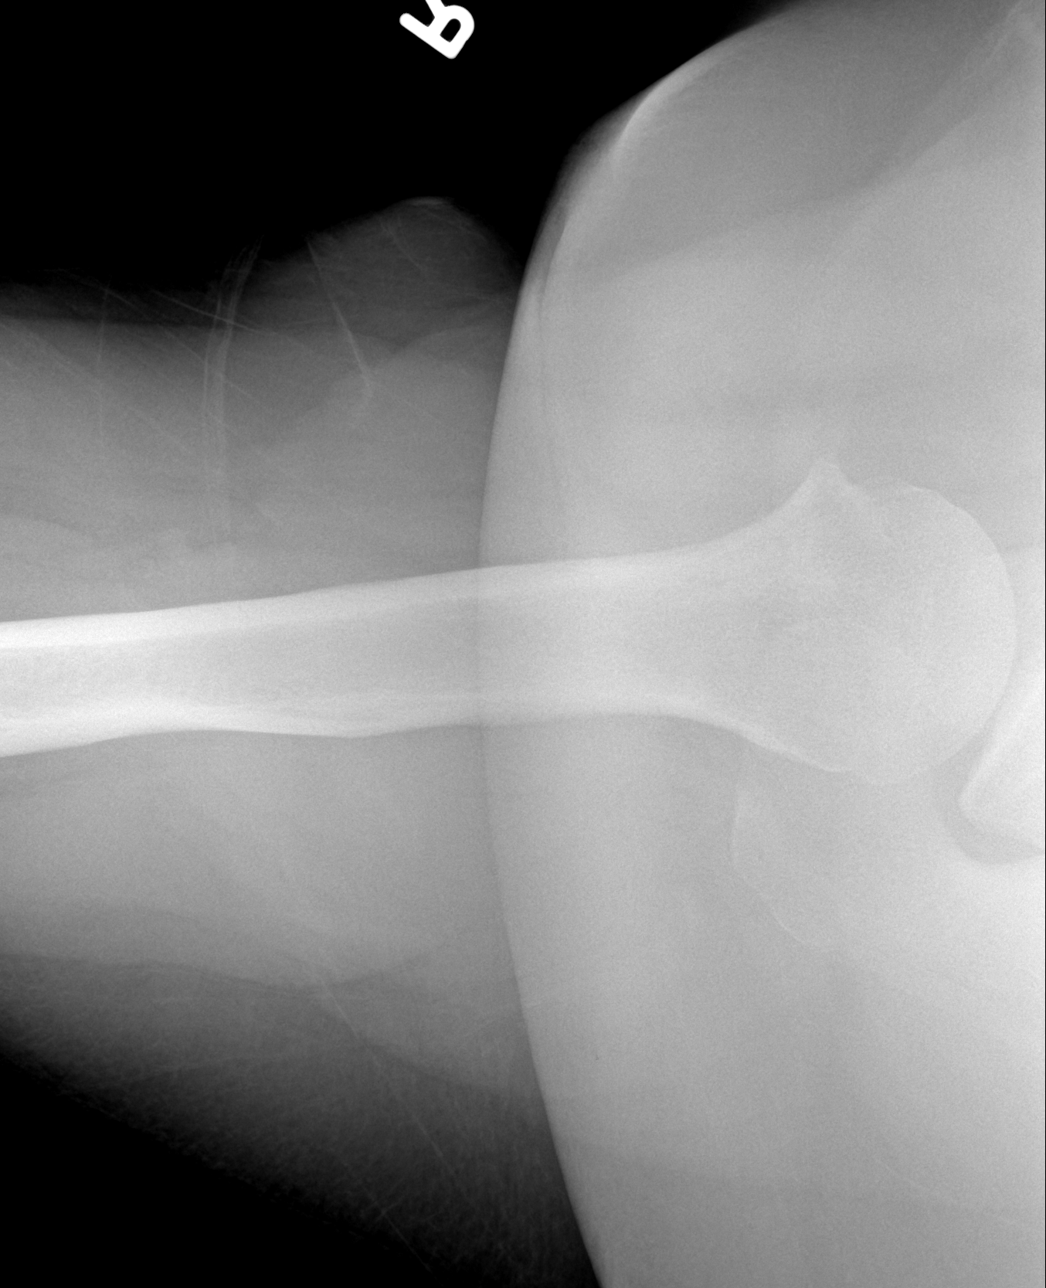

[shoulder axial (2 of 2)]
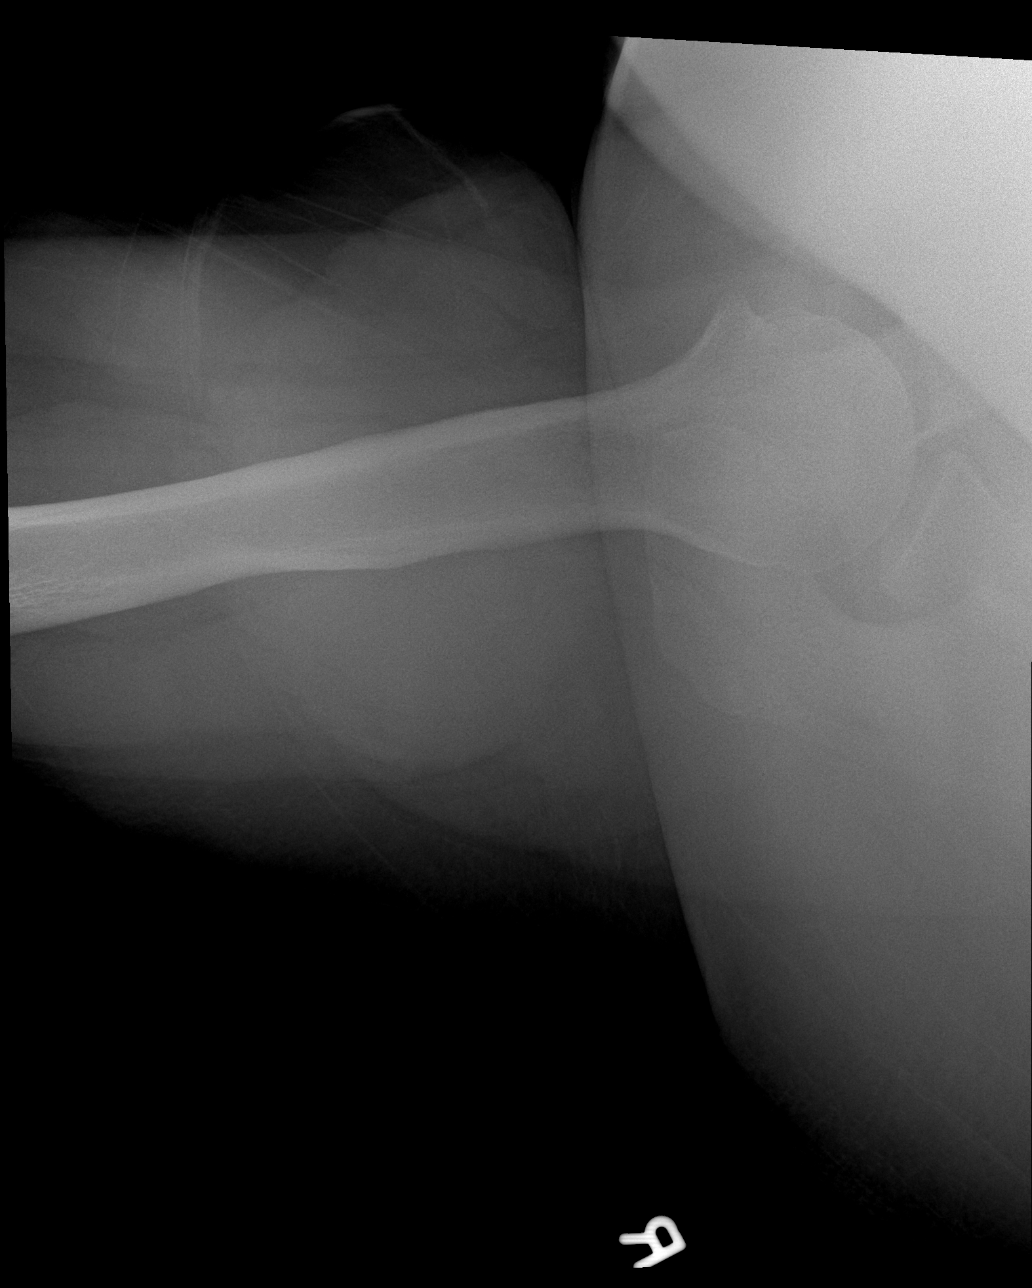

[4 of 4 positions shown; findings below may reference images not displayed]

FINDINGS: There is no acute fracture or dislocation. The bones are well
mineralized. No arthritic changes. The soft tissues are
unremarkable.
IMPRESSION: Negative.

## 2022-08-03 ENCOUNTER — Ambulatory Visit: Payer: BC Managed Care – PPO | Admitting: Internal Medicine

## 2022-08-03 NOTE — Progress Notes (Deleted)
Date:  08/03/2022   Name:  Elijah Wells   DOB:  January 22, 1991   MRN:  [id number]   Chief Complaint: No chief complaint on file.  Diabetes He presents for his follow-up diabetic visit. He has type 2 diabetes mellitus. His disease course has been stable. Pertinent negatives for diabetic complications include no CVA, heart disease, nephropathy or PVD. Current diabetic treatments: januvia, metformin, Ozempic, Farxiga. There is no change in his home blood glucose trend.  Hypertension This is a chronic problem. The problem is controlled. Past treatments include diuretics and ACE inhibitors. There is no history of CVA or PVD.  Hyperlipidemia This is a chronic problem. Recent lipid tests were reviewed and are high. He is currently on no antihyperlipidemic treatment.  Vitamin D def - on daily supplement 5000 IU  Lab Results  Component Value Date   NA 140 07/25/2021   K 4.4 07/25/2021   CO2 24 07/25/2021   GLUCOSE 144 (H) 07/25/2021   BUN 10 07/25/2021   CREATININE 1.03 07/25/2021   CALCIUM 10.2 07/25/2021   EGFR 100 07/25/2021   GFRNONAA 90 07/25/2020   Lab Results  Component Value Date   CHOL 130 07/25/2021   HDL 35 (L) 07/25/2021   LDLCALC 79 07/25/2021   TRIG 81 07/25/2021   CHOLHDL 3.7 07/25/2021   Lab Results  Component Value Date   TSH 1.700 07/25/2020   Lab Results  Component Value Date   HGBA1C 8.3 (A) 05/04/2022   Lab Results  Component Value Date   WBC 7.7 07/25/2021   HGB 15.0 07/25/2021   HCT 46.9 07/25/2021   MCV 81 07/25/2021   PLT 329 07/25/2021   Lab Results  Component Value Date   ALT 64 (H) 07/25/2021   AST 51 (H) 07/25/2021   ALKPHOS 85 07/25/2021   BILITOT 0.5 07/25/2021   Lab Results  Component Value Date   VD25OH 23.6 (L) 04/14/2020     Review of Systems  Patient Active Problem List   Diagnosis Date Noted   Abnormal liver enzymes 01/29/2022   Vitamin D deficiency 06/23/2020   Class 3 severe obesity with serious comorbidity and body  mass index (BMI) of 50.0 to 59.9 in adult ([place]) 06/23/2020   Type II diabetes mellitus with complication ([place]) 83/38/2505   Essential hypertension 09/20/2017   Gastroesophageal reflux disease 09/20/2017    No Known Allergies  Past Surgical History:  Procedure Laterality Date   none     WISDOM TOOTH EXTRACTION  01/2019    Social History   Tobacco Use   Smoking status: Never   Smokeless tobacco: Never  Vaping Use   Vaping Use: Never used  Substance Use Topics   Alcohol use: No   Drug use: No     Medication list has been reviewed and updated.  No outpatient medications have been marked as taking for the 08/03/22 encounter (Appointment) with Glean Hess, MD.       05/04/2022    2:41 PM 11/24/2021    9:08 AM 07/25/2021   10:49 AM 01/11/2021    2:25 PM  GAD 7 : Generalized Anxiety Score  Nervous, Anxious, on Edge 0 0 0 0  Control/stop worrying 0 0 0 0  Worry too much - different things 0 0 1 0  Trouble relaxing 0 0 0 0  Restless 0 0 0 0  Easily annoyed or irritable 0 0 0 0  Afraid - awful might happen 0 0 0 0  Total GAD  7 Score 0 0 1 0  Anxiety Difficulty Not difficult at all  Not difficult at all Not difficult at all       05/04/2022    2:41 PM 11/24/2021    9:07 AM 07/25/2021   10:49 AM  Depression screen PHQ 2/9  Decreased Interest 0 0 0  Down, Depressed, Hopeless 0 0 0  PHQ - 2 Score 0 0 0  Altered sleeping 1 0 0  Tired, decreased energy 1 1 1   Change in appetite 0 1 1  Feeling bad or failure about yourself  0 0 0  Trouble concentrating 0 1 0  Moving slowly or fidgety/restless 0 0 0  Suicidal thoughts 0 0 0  PHQ-9 Score 2 3 2   Difficult doing work/chores Not difficult at all Not difficult at all Not difficult at all    BP Readings from Last 3 Encounters:  05/04/22 126/70  11/24/21 130/84  07/25/21 136/78    Physical Exam  Wt Readings from Last 3 Encounters:  05/04/22 (!) 389 lb (176.4 kg)  11/24/21 (!) 392 lb (177.8 kg)  07/25/21 (!) 383  lb (173.7 kg)    There were no vitals taken for this visit.  Assessment and Plan:

## 2022-08-09 ENCOUNTER — Encounter: Payer: Self-pay | Admitting: Internal Medicine

## 2022-10-01 ENCOUNTER — Ambulatory Visit (INDEPENDENT_AMBULATORY_CARE_PROVIDER_SITE_OTHER): Payer: BC Managed Care – PPO | Admitting: Internal Medicine

## 2022-10-01 ENCOUNTER — Encounter: Payer: Self-pay | Admitting: Internal Medicine

## 2022-10-01 VITALS — BP 118/78 | HR 111 | Ht 65.0 in | Wt 397.0 lb

## 2022-10-01 DIAGNOSIS — E118 Type 2 diabetes mellitus with unspecified complications: Secondary | ICD-10-CM

## 2022-10-01 DIAGNOSIS — I10 Essential (primary) hypertension: Secondary | ICD-10-CM | POA: Diagnosis not present

## 2022-10-01 DIAGNOSIS — Z23 Encounter for immunization: Secondary | ICD-10-CM

## 2022-10-01 MED ORDER — HYDROCHLOROTHIAZIDE 25 MG PO TABS: 25.0000 mg | ORAL_TABLET | Freq: Every day | ORAL | 1 refills | Status: DC

## 2022-10-01 MED ORDER — LISINOPRIL 20 MG PO TABS: 20.0000 mg | ORAL_TABLET | Freq: Every day | ORAL | 1 refills | Status: DC

## 2022-10-01 NOTE — Progress Notes (Signed)
Date:  10/01/2022   Name:  Elijah Wells   DOB:  [birth year]   MRN:  [id number]   Chief Complaint: Hypertension and Diabetes  Diabetes He presents for his follow-up diabetic visit. He has type 2 diabetes mellitus. His disease course has been improving (Ozempic added in August). Pertinent negatives for hypoglycemia include no headaches or tremors. Pertinent negatives for diabetes include no chest pain, no fatigue, no polydipsia and no polyuria. Current diabetic treatments: Janumet, Wilder Glade and now Ozempic. He is compliant with treatment most of the time.  Hypertension This is a chronic problem. The problem is controlled. Pertinent negatives include no chest pain, headaches, palpitations or shortness of breath. Past treatments include ACE inhibitors and diuretics.    Lab Results  Component Value Date   NA 140 07/25/2021   K 4.4 07/25/2021   CO2 24 07/25/2021   GLUCOSE 144 (H) 07/25/2021   BUN 10 07/25/2021   CREATININE 1.03 07/25/2021   CALCIUM 10.2 07/25/2021   EGFR 100 07/25/2021   GFRNONAA 90 07/25/2020   Lab Results  Component Value Date   CHOL 130 07/25/2021   HDL 35 (L) 07/25/2021   LDLCALC 79 07/25/2021   TRIG 81 07/25/2021   CHOLHDL 3.7 07/25/2021   Lab Results  Component Value Date   TSH 1.700 07/25/2020   Lab Results  Component Value Date   HGBA1C 8.3 (A) 05/04/2022   Lab Results  Component Value Date   WBC 7.7 07/25/2021   HGB 15.0 07/25/2021   HCT 46.9 07/25/2021   MCV 81 07/25/2021   PLT 329 07/25/2021   Lab Results  Component Value Date   ALT 64 (H) 07/25/2021   AST 51 (H) 07/25/2021   ALKPHOS 85 07/25/2021   BILITOT 0.5 07/25/2021   Lab Results  Component Value Date   VD25OH 23.6 (L) 04/14/2020     Review of Systems  Constitutional:  Negative for appetite change, fatigue and unexpected weight change.  Eyes:  Negative for visual disturbance.  Respiratory:  Negative for cough, shortness of breath and wheezing.   Cardiovascular:   Negative for chest pain, palpitations and leg swelling.  Gastrointestinal:  Negative for abdominal pain and blood in stool.  Endocrine: Negative for polydipsia and polyuria.  Genitourinary:  Negative for dysuria and hematuria.  Skin:  Negative for color change and rash.  Neurological:  Negative for tremors, numbness and headaches.  Psychiatric/Behavioral:  Negative for dysphoric mood.     Patient Active Problem List   Diagnosis Date Noted   Abnormal liver enzymes 01/29/2022   Vitamin D deficiency 06/23/2020   Class 3 severe obesity with serious comorbidity and body mass index (BMI) of 50.0 to 59.9 in adult [facility]) 06/23/2020   Type II diabetes mellitus with complication ([place]) 44/81/8563   Essential hypertension 09/20/2017   Gastroesophageal reflux disease 09/20/2017    No Known Allergies  Past Surgical History:  Procedure Laterality Date   none     WISDOM TOOTH EXTRACTION  01/2019    Social History   Tobacco Use   Smoking status: Never   Smokeless tobacco: Never  Vaping Use   Vaping Use: Never used  Substance Use Topics   Alcohol use: No   Drug use: No     Medication list has been reviewed and updated.  Current Meds  Medication Sig   Cholecalciferol (VITAMIN D3) 1.25 MG (50000 UT) CAPS Take 1 capsule by mouth once a week.   Cholecalciferol (VITAMIN D3) 125 MCG (5000 UT) CAPS  Take 1 capsule (5,000 Units total) by mouth daily.   dapagliflozin propanediol (FARXIGA) 10 MG TABS tablet Take 1 tablet (10 mg total) by mouth daily before breakfast.   Insulin Pen Needle (PEN NEEDLES) 32G X 5 MM MISC 1 each by Does not apply route once a week.   omeprazole (PRILOSEC) 20 MG capsule Take 20 mg by mouth daily as needed.    Semaglutide, 1 MG/DOSE, 4 MG/3ML SOPN Inject 1 mg as directed once a week.   sitaGLIPtin-metformin (JANUMET) 50-1000 MG tablet Take 1 tablet by mouth 2 (two) times daily with a meal.   [DISCONTINUED] hydrochlorothiazide (HYDRODIURIL) 25 MG tablet Take 1 tablet  (25 mg total) by mouth daily.   [DISCONTINUED] lisinopril (ZESTRIL) 20 MG tablet Take 1 tablet (20 mg total) by mouth daily.       10/01/2022    1:17 PM 05/04/2022    2:41 PM 11/24/2021    9:08 AM 07/25/2021   10:49 AM  GAD 7 : Generalized Anxiety Score  Nervous, Anxious, on Edge 0 0 0 0  Control/stop worrying 0 0 0 0  Worry too much - different things 0 0 0 1  Trouble relaxing 0 0 0 0  Restless 0 0 0 0  Easily annoyed or irritable 0 0 0 0  Afraid - awful might happen 0 0 0 0  Total GAD 7 Score 0 0 0 1  Anxiety Difficulty Not difficult at all Not difficult at all  Not difficult at all       10/01/2022    1:16 PM 05/04/2022    2:41 PM 11/24/2021    9:07 AM  Depression screen PHQ 2/9  Decreased Interest 0 0 0  Down, Depressed, Hopeless 0 0 0  PHQ - 2 Score 0 0 0  Altered sleeping 0 1 0  Tired, decreased energy _0 Change in appetite 0 0 1  Feeling bad or failure about yourself  0 0 0  Trouble concentrating 0 0 1  Moving slowly or fidgety/restless 0 0 0  Suicidal thoughts 0 0 0  PHQ-9 Score _1 Difficult doing work/chores Not difficult at all Not difficult at all Not difficult at all    BP Readings from Last 3 Encounters:  10/01/22 118/78  05/04/22 126/70  11/24/21 130/84    Physical Exam Vitals and nursing note reviewed.  Constitutional:      General: He is not in acute distress.    Appearance: He is well-developed.  HENT:     Head: Normocephalic and atraumatic.  Cardiovascular:     Rate and Rhythm: Normal rate and regular rhythm.  Pulmonary:     Effort: Pulmonary effort is normal. No respiratory distress.     Breath sounds: No wheezing or rhonchi.  Musculoskeletal:     Cervical back: Normal range of motion.     Right lower leg: No edema.     Left lower leg: No edema.  Lymphadenopathy:     Cervical: No cervical adenopathy.  Skin:    General: Skin is warm and dry.     Findings: No rash.  Neurological:     Mental Status: He is alert and oriented to  person, place, and time.  Psychiatric:        Mood and Affect: Mood normal.        Behavior: Behavior normal.    Diabetic Foot Exam - Simple   Simple Foot Form Diabetic Foot exam was performed with the following findings:  Yes 10/01/2022  1:24 PM  Visual Inspection No deformities, no ulcerations, no other skin breakdown bilaterally: Yes Sensation Testing Intact to touch and monofilament testing bilaterally: Yes Pulse Check Posterior Tibialis and Dorsalis pulse intact bilaterally: Yes Comments      Wt Readings from Last 3 Encounters:  10/01/22 (!) 397 lb (180.1 kg)  05/04/22 (!) 389 lb (176.4 kg)  11/24/21 (!) 392 lb (177.8 kg)    BP 118/78   Pulse (!) 111   Ht _0  (1.651 m)   Wt (!) 397 lb (180.1 kg)   SpO2 94%   BMI 66.06 kg/m   Assessment and Plan: 1. Type II diabetes mellitus with complication (HCC) Not checking BS regularly and has missed some Ozempic doses We reviewed how and when to take Ozempic - even if a dose is missed. Continue to work on diet and exercise - CBC with Differential/Platelet - Comprehensive metabolic panel - Hemoglobin A1c - Microalbumin / creatinine urine ratio - HM Diabetes Foot Exam  2. Essential hypertension Clinically stable exam with well controlled BP. Tolerating medications without side effects at this time. Pt to continue current regimen and low sodium diet; benefits of regular exercise as able discussed. - hydrochlorothiazide (HYDRODIURIL) 25 MG tablet; Take 1 tablet (25 mg total) by mouth daily.  Dispense: 90 tablet; Refill: 1 - lisinopril (ZESTRIL) 20 MG tablet; Take 1 tablet (20 mg total) by mouth daily.  Dispense: 90 tablet; Refill: 1   Partially dictated using Editor, commissioning. Any errors are unintentional.  Halina Maidens, MD [facility]  10/01/2022

## 2022-10-02 LAB — CBC WITH DIFFERENTIAL/PLATELET
Basophils Absolute: 0 10*3/uL (ref 0.0–0.2)
Basos: 0 %
EOS (ABSOLUTE): 0.2 10*3/uL (ref 0.0–0.4)
Eos: 3 %
Hematocrit: 47.2 % (ref 37.5–51.0)
Hemoglobin: 15.1 g/dL (ref 13.0–17.7)
Immature Grans (Abs): 0 10*3/uL (ref 0.0–0.1)
Immature Granulocytes: 0 %
Lymphocytes Absolute: 1.6 10*3/uL (ref 0.7–3.1)
Lymphs: 25 %
MCH: 25.9 pg — ABNORMAL LOW (ref 26.6–33.0)
MCHC: 32 g/dL (ref 31.5–35.7)
MCV: 81 fL (ref 79–97)
Monocytes Absolute: 1 10*3/uL — ABNORMAL HIGH (ref 0.1–0.9)
Monocytes: 15 %
Neutrophils Absolute: 3.6 10*3/uL (ref 1.4–7.0)
Neutrophils: 57 %
Platelets: 282 10*3/uL (ref 150–450)
RBC: 5.84 x10E6/uL — ABNORMAL HIGH (ref 4.14–5.80)
RDW: 15.4 % (ref 11.6–15.4)
WBC: 6.4 10*3/uL (ref 3.4–10.8)

## 2022-10-02 LAB — COMPREHENSIVE METABOLIC PANEL
ALT: 101 IU/L — ABNORMAL HIGH (ref 0–44)
AST: 112 IU/L — ABNORMAL HIGH (ref 0–40)
Albumin/Globulin Ratio: 1.1 — ABNORMAL LOW (ref 1.2–2.2)
Albumin: 4.4 g/dL (ref 4.1–5.1)
Alkaline Phosphatase: 81 IU/L (ref 44–121)
BUN/Creatinine Ratio: 8 — ABNORMAL LOW (ref 9–20)
BUN: 9 mg/dL (ref 6–20)
Bilirubin Total: 0.6 mg/dL (ref 0.0–1.2)
CO2: 24 mmol/L (ref 20–29)
Calcium: 9.9 mg/dL (ref 8.7–10.2)
Chloride: 100 mmol/L (ref 96–106)
Creatinine, Ser: 1.2 mg/dL (ref 0.76–1.27)
Globulin, Total: 3.9 g/dL (ref 1.5–4.5)
Glucose: 166 mg/dL — ABNORMAL HIGH (ref 70–99)
Potassium: 3.8 mmol/L (ref 3.5–5.2)
Sodium: 140 mmol/L (ref 134–144)
Total Protein: 8.3 g/dL (ref 6.0–8.5)
eGFR: 82 mL/min/{1.73_m2} (ref >59)

## 2022-10-02 LAB — HEMOGLOBIN A1C
Est. average glucose Bld gHb Est-mCnc: 220 mg/dL
Hgb A1c MFr Bld: 9.3 % — ABNORMAL HIGH (ref 4.8–5.6)

## 2022-10-02 LAB — MICROALBUMIN / CREATININE URINE RATIO
Creatinine, Urine: 87.1 mg/dL
Microalb/Creat Ratio: 182 mg/g creat — ABNORMAL HIGH (ref 0–29)
Microalbumin, Urine: 158.6 ug/mL

## 2022-10-03 ENCOUNTER — Other Ambulatory Visit: Payer: Self-pay | Admitting: Internal Medicine

## 2022-10-03 DIAGNOSIS — E118 Type 2 diabetes mellitus with unspecified complications: Secondary | ICD-10-CM

## 2022-10-03 MED ORDER — SEMAGLUTIDE (1 MG/DOSE) 4 MG/3ML ~~LOC~~ SOPN: 1.0000 mg | PEN_INJECTOR | SUBCUTANEOUS | 1 refills | Status: DC

## 2022-10-03 NOTE — Telephone Encounter (Signed)
Requested Prescriptions  Pending Prescriptions Disp Refills   Semaglutide, 1 MG/DOSE, 4 MG/3ML SOPN 3 mL 1    Sig: Inject 1 mg as directed once a week.     Endocrinology:  Diabetes - GLP-1 Receptor Agonists - semaglutide Failed - 10/03/2022  8:41 AM      Failed - HBA1C in normal range and within 180 days    Hemoglobin A1C  Date Value Ref Range Status  10/22/2013 14.0 (H) 4.2 - 6.3 % Final    Comment:    The American Diabetes Association recommends that a primary goal of therapy should be <7% and that physicians should reevaluate the treatment regimen in patients with HbA1c values consistently >8%.    Hgb A1c MFr Bld  Date Value Ref Range Status  10/01/2022 9.3 (H) 4.8 - 5.6 % Final    Comment:             Prediabetes: 5.7 - 6.4          Diabetes: >6.4          Glycemic control for adults with diabetes: <7.0          Passed - Cr in normal range and within 360 days    Creatinine  Date Value Ref Range Status  10/22/2013 1.45 (H) 0.60 - 1.30 mg/dL Final   Creatinine, Ser  Date Value Ref Range Status  10/01/2022 1.20 0.76 - 1.27 mg/dL Final         Passed - Valid encounter within last 6 months    Recent Outpatient Visits           2 days ago Type II diabetes mellitus with complication [facility])   [place] [facility] at [facility], Nyoka Cowden, MD   5 months ago Type II diabetes mellitus with complication [facility])   [place] [facility] at [facility], Nyoka Cowden, MD   10 months ago Type II diabetes mellitus with complication [facility])   [place] [facility] Medicine at [facility], Nyoka Cowden, MD   1 year ago Essential hypertension   [place] [facility] at [facility], Nyoka Cowden, MD   1 year ago Type II diabetes mellitus with complication [facility])    Primary Care and Sports Medicine at [facility], Nyoka Cowden, MD       Future  Appointments             In 4 months Judithann Graves, Nyoka Cowden, MD [facility] and [facility] at [facility], [facility]

## 2022-10-03 NOTE — Telephone Encounter (Unsigned)
Copied from CRM [id number]. Topic: General - Other >> Oct 03, 2022  8:34 AM Everette C wrote: Reason for CRM: Medication Refill - Medication: Semaglutide, 1 MG/DOSE, 4 MG/3ML SOPN [[id number]]  Has the patient contacted their pharmacy? Yes.   (Agent: If no, request that the patient contact the pharmacy for the refill. If patient does not wish to contact the pharmacy document the reason why and proceed with request.) (Agent: If yes, when and what did the pharmacy advise?)  Preferred Pharmacy (with phone number or street name): [facility] [address], [place] - [address] [place] [address] Phone: [phone] Fax: [phone] Hours: Not open 24 hours  Has the patient been seen for an appointment in the last year OR does the patient have an upcoming appointment? Yes.    Agent: Please be advised that RX refills may take up to 3 business days. We ask that you follow-up with your pharmacy.

## 2022-10-04 ENCOUNTER — Telehealth: Payer: Self-pay | Admitting: Internal Medicine

## 2022-10-04 NOTE — Telephone Encounter (Signed)
Given to patient, see result notes.

## 2022-10-04 NOTE — Telephone Encounter (Signed)
Copied from CRM [id number]. Topic: Quick Communication - Lab Results (Clinic Use ONLY) >> Oct 04, 2022  8:15 AM Lovett Calender wrote: Called patient to inform them of recent lab results. When patient returns call, triage nurse may disclose results. >> Oct 04, 2022  9:12 AM Macon Large wrote: Pt returned call for lab results. Pt requests call back to go over results. Cb# [phone]

## 2022-10-04 NOTE — Telephone Encounter (Signed)
Called, left VM to call back for results.

## 2022-10-21 ENCOUNTER — Other Ambulatory Visit: Payer: Self-pay | Admitting: Internal Medicine

## 2022-12-02 ENCOUNTER — Encounter: Payer: Self-pay | Admitting: Internal Medicine

## 2022-12-03 ENCOUNTER — Other Ambulatory Visit: Payer: Self-pay

## 2022-12-03 DIAGNOSIS — E118 Type 2 diabetes mellitus with unspecified complications: Secondary | ICD-10-CM

## 2022-12-03 MED ORDER — JANUMET 50-1000 MG PO TABS: 1.0000 | ORAL_TABLET | Freq: Two times a day (BID) | ORAL | 0 refills | Status: DC

## 2022-12-17 ENCOUNTER — Ambulatory Visit: Payer: BC Managed Care – PPO | Admitting: Internal Medicine

## 2022-12-17 ENCOUNTER — Encounter: Payer: Self-pay | Admitting: Internal Medicine

## 2022-12-17 VITALS — BP 118/72 | HR 97 | Ht 65.0 in | Wt 391.0 lb

## 2022-12-17 DIAGNOSIS — I10 Essential (primary) hypertension: Secondary | ICD-10-CM | POA: Diagnosis not present

## 2022-12-17 DIAGNOSIS — E118 Type 2 diabetes mellitus with unspecified complications: Secondary | ICD-10-CM | POA: Diagnosis not present

## 2022-12-17 LAB — POCT GLYCOSYLATED HEMOGLOBIN (HGB A1C): Hemoglobin A1C: 9 % — AB (ref 4.0–5.6)

## 2022-12-17 MED ORDER — SEMAGLUTIDE (2 MG/DOSE) 8 MG/3ML ~~LOC~~ SOPN: 2.0000 mg | PEN_INJECTOR | SUBCUTANEOUS | 3 refills | Status: DC

## 2022-12-17 NOTE — Progress Notes (Signed)
Date:  12/17/2022   Name:  Elijah Wells   DOB:  1991-10-05   MRN:  [id number]   Chief Complaint: Diabetes and Hypertension  Hypertension This is a chronic problem. The problem is controlled. Pertinent negatives include no chest pain, headaches, palpitations or shortness of breath. Past treatments include ACE inhibitors and diuretics. The current treatment provides significant improvement.  Diabetes He presents for his follow-up diabetic visit. He has type 2 diabetes mellitus. Pertinent negatives for hypoglycemia include no headaches or tremors. Pertinent negatives for diabetes include no chest pain, no fatigue, no polydipsia and no polyuria. Current diabetic treatments: ozempic, janumet, farxiga. He is compliant with treatment most of the time. An ACE inhibitor/angiotensin II receptor blocker is being taken. Eye exam is not current.    Lab Results  Component Value Date   NA 140 10/01/2022   K 3.8 10/01/2022   CO2 24 10/01/2022   GLUCOSE 166 (H) 10/01/2022   BUN 9 10/01/2022   CREATININE 1.20 10/01/2022   CALCIUM 9.9 10/01/2022   EGFR 82 10/01/2022   GFRNONAA 90 07/25/2020   Lab Results  Component Value Date   CHOL 130 07/25/2021   HDL 35 (L) 07/25/2021   LDLCALC 79 07/25/2021   TRIG 81 07/25/2021   CHOLHDL 3.7 07/25/2021   Lab Results  Component Value Date   TSH 1.700 07/25/2020   Lab Results  Component Value Date   HGBA1C 9.0 (A) 12/17/2022   Lab Results  Component Value Date   WBC 6.4 10/01/2022   HGB 15.1 10/01/2022   HCT 47.2 10/01/2022   MCV 81 10/01/2022   PLT 282 10/01/2022   Lab Results  Component Value Date   ALT 101 (H) 10/01/2022   AST 112 (H) 10/01/2022   ALKPHOS 81 10/01/2022   BILITOT 0.6 10/01/2022   Lab Results  Component Value Date   VD25OH 23.6 (L) 04/14/2020     Review of Systems  Constitutional:  Negative for appetite change, fatigue and unexpected weight change.  Eyes:  Negative for visual disturbance.  Respiratory:  Negative  for cough, shortness of breath and wheezing.   Cardiovascular:  Negative for chest pain, palpitations and leg swelling.  Gastrointestinal:  Negative for abdominal pain and blood in stool.  Endocrine: Negative for polydipsia and polyuria.  Genitourinary:  Negative for dysuria and hematuria.  Skin:  Negative for color change and rash.  Neurological:  Negative for tremors, numbness and headaches.  Psychiatric/Behavioral:  Negative for dysphoric mood.     Patient Active Problem List   Diagnosis Date Noted   Abnormal liver enzymes 01/29/2022   Vitamin D deficiency 06/23/2020   Class 3 severe obesity with serious comorbidity and body mass index (BMI) of 50.0 to 59.9 in adult [facility]) 06/23/2020   Type II diabetes mellitus with complication ([place]) [id number]   Essential hypertension 09/20/2017   Gastroesophageal reflux disease 09/20/2017    No Known Allergies  Past Surgical History:  Procedure Laterality Date   none     WISDOM TOOTH EXTRACTION  01/2019    Social History   Tobacco Use   Smoking status: Never   Smokeless tobacco: Never  Vaping Use   Vaping Use: Never used  Substance Use Topics   Alcohol use: No   Drug use: No     Medication list has been reviewed and updated.  Current Meds  Medication Sig   Cholecalciferol (VITAMIN D3) 1.25 MG (50000 UT) CAPS Take 1 capsule by mouth once a week  Cholecalciferol (VITAMIN D3) 125 MCG (5000 UT) CAPS Take 1 capsule (5,000 Units total) by mouth daily.   dapagliflozin propanediol (FARXIGA) 10 MG TABS tablet Take 1 tablet (10 mg total) by mouth daily before breakfast.   hydrochlorothiazide (HYDRODIURIL) 25 MG tablet Take 1 tablet (25 mg total) by mouth daily.   Insulin Pen Needle (PEN NEEDLES) 32G X 5 MM MISC 1 each by Does not apply route once a week.   lisinopril (ZESTRIL) 20 MG tablet Take 1 tablet (20 mg total) by mouth daily.   omeprazole (PRILOSEC) 20 MG capsule Take 20 mg by mouth daily as needed.    Semaglutide, 2 MG/DOSE, 8  MG/3ML SOPN Inject 2 mg as directed once a week.   sitaGLIPtin-metformin (JANUMET) 50-1000 MG tablet Take 1 tablet by mouth 2 (two) times daily with a meal.   [DISCONTINUED] Semaglutide, 1 MG/DOSE, 4 MG/3ML SOPN Inject 1 mg as directed once a week.       12/17/2022    3:10 PM 10/01/2022    1:17 PM 05/04/2022    2:41 PM 11/24/2021    9:08 AM  GAD 7 : Generalized Anxiety Score  Nervous, Anxious, on Edge 0 0 0 0  Control/stop worrying 0 0 0 0  Worry too much - different things 1 0 0 0  Trouble relaxing 0 0 0 0  Restless 0 0 0 0  Easily annoyed or irritable 0 0 0 0  Afraid - awful might happen 0 0 0 0  Total GAD 7 Score 1 0 0 0  Anxiety Difficulty Not difficult at all Not difficult at all Not difficult at all        12/17/2022    3:10 PM 10/01/2022    1:16 PM 05/04/2022    2:41 PM  Depression screen PHQ 2/9  Decreased Interest 1 0 0  Down, Depressed, Hopeless 0 0 0  PHQ - 2 Score 1 0 0  Altered sleeping 0 0 1  Tired, decreased energy 0 1 1  Change in appetite 1 0 0  Feeling bad or failure about yourself  0 0 0  Trouble concentrating 0 0 0  Moving slowly or fidgety/restless 0 0 0  Suicidal thoughts 0 0 0  PHQ-9 Score '2 1 2  '$ Difficult doing work/chores Not difficult at all Not difficult at all Not difficult at all    BP Readings from Last 3 Encounters:  12/17/22 118/72  10/01/22 118/78  05/04/22 126/70    Physical Exam Vitals and nursing note reviewed.  Constitutional:      General: He is not in acute distress.    Appearance: He is well-developed.  HENT:     Head: Normocephalic and atraumatic.  Pulmonary:     Effort: Pulmonary effort is normal. No respiratory distress.  Skin:    General: Skin is warm and dry.     Findings: No rash.  Neurological:     Mental Status: He is alert and oriented to person, place, and time.  Psychiatric:        Mood and Affect: Mood normal.        Behavior: Behavior normal.     Wt Readings from Last 3 Encounters:  12/17/22 (!) 391 lb  (177.4 kg)  10/01/22 (!) 397 lb (180.1 kg)  05/04/22 (!) 389 lb (176.4 kg)    BP 118/72   Pulse 97   Ht '5\' 5"'$  (1.651 m)   Wt (!) 391 lb (177.4 kg)   SpO2 97%   BMI  65.07 kg/m   Assessment and Plan: Problem List Items Addressed This Visit       Cardiovascular and Mediastinum   Essential hypertension (Chronic)    Clinically stable exam with well controlled BP on lisinopril and hctz. Tolerating medications without side effects. Pt to continue current regimen and low sodium diet.         Endocrine   Type II diabetes mellitus with complication ([place]) - Primary (Chronic)    Currently on Ozempic, Farxiga, and janumet Lab Results  Component Value Date   HGBA1C 9.3 (H) 10/01/2022  Reinforced medication compliance and dietary changes He has been doing better - lost some weight. Will increase ozempic to 2 mg. Consider stopping Janumet next visit - use metformin alone      Relevant Medications   Semaglutide, 2 MG/DOSE, 8 MG/3ML SOPN   Other Relevant Orders   POCT glycosylated hemoglobin (Hb A1C) (Completed)     Partially dictated using Editor, commissioning. Any errors are unintentional.  Halina Maidens, MD [facility]  12/17/2022

## 2022-12-17 NOTE — Assessment & Plan Note (Signed)
Clinically stable exam with well controlled BP on lisinopril and hctz. Tolerating medications without side effects. Pt to continue current regimen and low sodium diet.

## 2022-12-17 NOTE — Assessment & Plan Note (Addendum)
Currently on Ozempic, Farxiga, and janumet Lab Results  Component Value Date   HGBA1C 9.3 (H) 10/01/2022  Reinforced medication compliance and dietary changes He has been doing better - lost some weight. Will increase ozempic to 2 mg. Consider stopping Janumet next visit - use metformin alone

## 2022-12-24 ENCOUNTER — Ambulatory Visit: Payer: BC Managed Care – PPO | Admitting: Internal Medicine

## 2023-01-31 ENCOUNTER — Ambulatory Visit: Payer: BC Managed Care – PPO | Admitting: Internal Medicine

## 2023-03-03 ENCOUNTER — Other Ambulatory Visit: Payer: Self-pay | Admitting: Internal Medicine

## 2023-03-03 DIAGNOSIS — I10 Essential (primary) hypertension: Secondary | ICD-10-CM

## 2023-03-13 ENCOUNTER — Other Ambulatory Visit: Payer: Self-pay | Admitting: Internal Medicine

## 2023-03-13 DIAGNOSIS — E118 Type 2 diabetes mellitus with unspecified complications: Secondary | ICD-10-CM

## 2023-03-19 ENCOUNTER — Ambulatory Visit: Payer: BC Managed Care – PPO | Admitting: Internal Medicine

## 2023-03-19 ENCOUNTER — Encounter: Payer: Self-pay | Admitting: Internal Medicine

## 2023-03-19 VITALS — BP 128/76 | HR 99 | Ht 65.0 in | Wt 387.0 lb

## 2023-03-19 DIAGNOSIS — E119 Type 2 diabetes mellitus without complications: Secondary | ICD-10-CM

## 2023-03-19 DIAGNOSIS — I10 Essential (primary) hypertension: Secondary | ICD-10-CM

## 2023-03-19 DIAGNOSIS — E118 Type 2 diabetes mellitus with unspecified complications: Secondary | ICD-10-CM | POA: Diagnosis not present

## 2023-03-19 DIAGNOSIS — Z7984 Long term (current) use of oral hypoglycemic drugs: Secondary | ICD-10-CM

## 2023-03-19 LAB — POCT GLYCOSYLATED HEMOGLOBIN (HGB A1C): Hemoglobin A1C: 9.7 % — AB (ref 4.0–5.6)

## 2023-03-19 MED ORDER — DAPAGLIFLOZIN PROPANEDIOL 10 MG PO TABS: 10.0000 mg | ORAL_TABLET | Freq: Every day | ORAL | 1 refills | Status: DC

## 2023-03-19 MED ORDER — DEXCOM G6 SENSOR MISC
1.0000 | 3 refills | Status: AC
Start: 2023-03-19 — End: ?

## 2023-03-19 NOTE — Assessment & Plan Note (Addendum)
DM uncontrolled last visit. On Janumet, Farxiga and Ozempic 1 mg. Lab Results  Component Value Date   HGBA1C 9.0 (A) 12/17/2022  Ozempic was increased to 2 mg. POC A1C today was 9.7. Recommend starting DEXCOM for more accountability and [facility] A1C to confirm. Sample DEXCOM 7 given.

## 2023-03-19 NOTE — Assessment & Plan Note (Signed)
Stable exam with well controlled BP.  Currently taking lisinopril and hctz. Tolerating medications without concerns or side effects. Will continue to recommend low sodium diet and current regimen.  

## 2023-03-19 NOTE — Progress Notes (Signed)
Date:  03/19/2023   Name:  Elijah Wells   DOB:  09-08-1991   MRN:  [id number]   Chief Complaint: Diabetes  Diabetes He presents for his follow-up diabetic visit. He has type 2 diabetes mellitus. His disease course has been improving. Pertinent negatives for hypoglycemia include no headaches or tremors. Pertinent negatives for diabetes include no chest pain, no fatigue, no polydipsia and no polyuria.  Hypertension This is a chronic problem. The problem is controlled. Pertinent negatives include no chest pain, headaches, palpitations or shortness of breath. Past treatments include ACE inhibitors and diuretics.    Lab Results  Component Value Date   NA 140 10/01/2022   K 3.8 10/01/2022   CO2 24 10/01/2022   GLUCOSE 166 (H) 10/01/2022   BUN 9 10/01/2022   CREATININE 1.20 10/01/2022   CALCIUM 9.9 10/01/2022   EGFR 82 10/01/2022   GFRNONAA 90 07/25/2020   Lab Results  Component Value Date   CHOL 130 07/25/2021   HDL 35 (L) 07/25/2021   LDLCALC 79 07/25/2021   TRIG 81 07/25/2021   CHOLHDL 3.7 07/25/2021   Lab Results  Component Value Date   TSH 1.700 07/25/2020   Lab Results  Component Value Date   HGBA1C 9.7 (A) 03/19/2023   Lab Results  Component Value Date   WBC 6.4 10/01/2022   HGB 15.1 10/01/2022   HCT 47.2 10/01/2022   MCV 81 10/01/2022   PLT 282 10/01/2022   Lab Results  Component Value Date   ALT 101 (H) 10/01/2022   AST 112 (H) 10/01/2022   ALKPHOS 81 10/01/2022   BILITOT 0.6 10/01/2022   Lab Results  Component Value Date   VD25OH 23.6 (L) 04/14/2020     Review of Systems  Constitutional:  Negative for appetite change, fatigue and unexpected weight change.  Eyes:  Negative for visual disturbance.  Respiratory:  Negative for cough, shortness of breath and wheezing.   Cardiovascular:  Negative for chest pain, palpitations and leg swelling.  Gastrointestinal:  Negative for abdominal pain and blood in stool.  Endocrine: Negative for polydipsia and  polyuria.  Genitourinary:  Negative for dysuria and hematuria.  Skin:  Negative for color change and rash.  Neurological:  Negative for tremors, numbness and headaches.  Psychiatric/Behavioral:  Negative for dysphoric mood.     Patient Active Problem List   Diagnosis Date Noted   Abnormal liver enzymes 01/29/2022   Vitamin D deficiency 06/23/2020   Class 3 severe obesity with serious comorbidity and body mass index (BMI) of 50.0 to 59.9 in adult [facility]) 06/23/2020   Type II diabetes mellitus with complication (HCC) 09/20/2017   Essential hypertension 09/20/2017   Gastroesophageal reflux disease 09/20/2017    No Known Allergies  Past Surgical History:  Procedure Laterality Date   none     WISDOM TOOTH EXTRACTION  01/2019    Social History   Tobacco Use   Smoking status: Never   Smokeless tobacco: Never  Vaping Use   Vaping Use: Never used  Substance Use Topics   Alcohol use: No   Drug use: No     Medication list has been reviewed and updated.  Current Meds  Medication Sig   Cholecalciferol (VITAMIN D3) 1.25 MG (50000 UT) CAPS Take 1 capsule by mouth once a week   Continuous Glucose Sensor (DEXCOM G6 SENSOR) MISC 1 each by Does not apply route continuous.   hydrochlorothiazide (HYDRODIURIL) 25 MG tablet TAKE 1 TABLET DAILY   Insulin Pen Needle (  PEN NEEDLES) 32G X 5 MM MISC 1 each by Does not apply route once a week.   JANUMET 50-1000 MG tablet TAKE 1 TABLET BY MOUTH TWICE DAILY WITH A MEAL   lisinopril (ZESTRIL) 20 MG tablet TAKE 1 TABLET DAILY   omeprazole (PRILOSEC) 20 MG capsule Take 20 mg by mouth daily as needed.    Semaglutide, 2 MG/DOSE, 8 MG/3ML SOPN Inject 2 mg as directed once a week.   [DISCONTINUED] Cholecalciferol (VITAMIN D3) 125 MCG (5000 UT) CAPS Take 1 capsule (5,000 Units total) by mouth daily.   [DISCONTINUED] dapagliflozin propanediol (FARXIGA) 10 MG TABS tablet Take 1 tablet (10 mg total) by mouth daily before breakfast.       03/19/2023    2:47  PM 12/17/2022    3:10 PM 10/01/2022    1:17 PM 05/04/2022    2:41 PM  GAD 7 : Generalized Anxiety Score  Nervous, Anxious, on Edge 1 0 0 0  Control/stop worrying 0 0 0 0  Worry too much - different things 1 1 0 0  Trouble relaxing 0 0 0 0  Restless 0 0 0 0  Easily annoyed or irritable 0 0 0 0  Afraid - awful might happen 0 0 0 0  Total GAD 7 Score 2 1 0 0  Anxiety Difficulty Not difficult at all Not difficult at all Not difficult at all Not difficult at all       03/19/2023    2:47 PM 12/17/2022    3:10 PM 10/01/2022    1:16 PM  Depression screen PHQ 2/9  Decreased Interest 0 1 0  Down, Depressed, Hopeless 0 0 0  PHQ - 2 Score 0 1 0  Altered sleeping 0 0 0  Tired, decreased energy 1 0 1  Change in appetite 1 1 0  Feeling bad or failure about yourself  0 0 0  Trouble concentrating 1 0 0  Moving slowly or fidgety/restless 0 0 0  Suicidal thoughts 0 0 0  PHQ-9 Score 3 2 1   Difficult doing work/chores Not difficult at all Not difficult at all Not difficult at all    BP Readings from Last 3 Encounters:  03/19/23 128/76  12/17/22 118/72  10/01/22 118/78    Physical Exam Vitals and nursing note reviewed.  Constitutional:      General: He is not in acute distress.    Appearance: Normal appearance. He is well-developed.  HENT:     Head: Normocephalic and atraumatic.  Cardiovascular:     Rate and Rhythm: Normal rate and regular rhythm.  Pulmonary:     Effort: Pulmonary effort is normal. No respiratory distress.     Breath sounds: No wheezing or rhonchi.  Musculoskeletal:     Cervical back: Normal range of motion.     Right lower leg: No edema.     Left lower leg: No edema.  Lymphadenopathy:     Cervical: No cervical adenopathy.  Skin:    General: Skin is warm and dry.     Findings: No rash.  Neurological:     General: No focal deficit present.     Mental Status: He is alert and oriented to person, place, and time.  Psychiatric:        Mood and Affect: Mood normal.         Behavior: Behavior normal.     Wt Readings from Last 3 Encounters:  03/19/23 (!) 387 lb (175.5 kg)  12/17/22 (!) 391 lb (177.4 kg)  10/01/22 [facility])  397 lb (180.1 kg)    BP 128/76   Pulse 99   Ht 5\' 5"  (1.651 m)   Wt (!) 387 lb (175.5 kg)   SpO2 99%   BMI 64.40 kg/m   Assessment and Plan:  Problem List Items Addressed This Visit     Type II diabetes mellitus with complication (HCC) - Primary    DM uncontrolled last visit. On Janumet, Farxiga and Ozempic 1 mg. Lab Results  Component Value Date   HGBA1C 9.0 (A) 12/17/2022  Ozempic was increased to 2 mg. POC A1C today was 9.7. Recommend starting DEXCOM for more accountability and [facility] A1C to confirm. Sample DEXCOM 7 given.       Relevant Medications   dapagliflozin propanediol (FARXIGA) 10 MG TABS tablet   Continuous Glucose Sensor (DEXCOM G6 SENSOR) MISC   Other Relevant Orders   Hemoglobin A1c   Essential hypertension (Chronic)    Stable exam with well controlled BP.  Currently taking lisinopril and hctz. Tolerating medications without concerns or side effects. Will continue to recommend low sodium diet and current regimen.        Return in about 3 months (around 06/19/2023) for DM.   Partially dictated using Dragon software, any errors are not intentional.  Reubin Milan, MD [facility] [place], [place]

## 2023-03-20 LAB — HEMOGLOBIN A1C
Est. average glucose Bld gHb Est-mCnc: 263 mg/dL
Hgb A1c MFr Bld: 10.8 % — ABNORMAL HIGH (ref 4.8–5.6)

## 2023-03-28 ENCOUNTER — Ambulatory Visit
Admission: EM | Admit: 2023-03-28 | Discharge: 2023-03-28 | Disposition: A | Discharge status: Home / Self Care | Payer: BC Managed Care – PPO | Attending: Nurse Practitioner | Admitting: Nurse Practitioner

## 2023-03-28 DIAGNOSIS — L0291 Cutaneous abscess, unspecified: Secondary | ICD-10-CM | POA: Diagnosis not present

## 2023-03-28 MED ORDER — SULFAMETHOXAZOLE-TRIMETHOPRIM 800-160 MG PO TABS
1.0000 | ORAL_TABLET | Freq: Two times a day (BID) | ORAL | 0 refills | Status: AC
Start: 2023-03-28 — End: 2023-04-04

## 2023-03-28 MED ORDER — MUPIROCIN CALCIUM 2 % EX CREA
1.0000 | TOPICAL_CREAM | Freq: Two times a day (BID) | CUTANEOUS | 0 refills | Status: AC
Start: 2023-03-28 — End: ?

## 2023-03-28 NOTE — ED Triage Notes (Signed)
Pt c/o abscess in mid back x2 days. States he tends to get them time to time, has foul odor & drainage currently. Denies any fevers.

## 2023-03-28 NOTE — Discharge Instructions (Addendum)
Start Bactrim twice daily for 7 days.  Apply mupirocin antibiotic ointment topically twice daily for 7 days as well.  Keep area covered.  Use warm compresses to encourage the area to drain Follow-up with your PCP if your symptoms do not improve Please go to the ER for any worsening symptoms I hope you feel better soon!

## 2023-03-28 NOTE — ED Provider Notes (Addendum)
[facility]    CSN: [id number] Arrival date & time: 03/28/23  0851      History   Chief Complaint Chief Complaint  Patient presents with   Abscess    HPI Elijah Wells is a 32 y.o. male presents for evaluation of an abscess.  Patient reports 2 days of a painful abscess to his mid low back.  States he has had these before and they typically go away in their own but this 1 has been more persistent.  States today began draining some purulent malodorous discharge causing him to change his shirt twice.  No fevers or chills.  Pt is diabetic. No history of MRSA.  He has not used any OTC medications since symptoms began.  No other concerns at this time.   Abscess   Past Medical History:  Diagnosis Date   Diabetes mellitus without complication (HCC)    Hypertension    Morbid obesity (HCC)    Sleep apnea    Synovitis of ankle 09/20/2017   Seen by Dr. Orland Jarred    Patient Active Problem List   Diagnosis Date Noted   Abnormal liver enzymes 01/29/2022   Vitamin D deficiency 06/23/2020   Class 3 severe obesity with serious comorbidity and body mass index (BMI) of 50.0 to 59.9 in adult [facility]) 06/23/2020   Type II diabetes mellitus with complication (HCC) 09/20/2017   Essential hypertension 09/20/2017   Gastroesophageal reflux disease 09/20/2017    Past Surgical History:  Procedure Laterality Date   none     WISDOM TOOTH EXTRACTION  01/2019       Home Medications    Prior to Admission medications   Medication Sig Start Date End Date Taking? Authorizing Provider  Cholecalciferol (VITAMIN D3) 1.25 MG (50000 UT) CAPS Take 1 capsule by mouth once a week 10/22/22  Yes Reubin Milan, MD  Continuous Glucose Sensor (DEXCOM G6 SENSOR) MISC 1 each by Does not apply route continuous. 03/19/23  Yes Reubin Milan, MD  dapagliflozin propanediol (FARXIGA) 10 MG TABS tablet Take 1 tablet (10 mg total) by mouth daily before breakfast. 03/19/23  Yes Reubin Milan, MD   hydrochlorothiazide (HYDRODIURIL) 25 MG tablet TAKE 1 TABLET DAILY 03/04/23  Yes Reubin Milan, MD  Insulin Pen Needle (PEN NEEDLES) 32G X 5 MM MISC 1 each by Does not apply route once a week. 05/04/22  Yes Reubin Milan, MD  JANUMET 50-1000 MG tablet TAKE 1 TABLET BY MOUTH TWICE DAILY WITH A MEAL 03/13/23  Yes Reubin Milan, MD  lisinopril (ZESTRIL) 20 MG tablet TAKE 1 TABLET DAILY 03/04/23  Yes Reubin Milan, MD  mupirocin cream (BACTROBAN) 2 % Apply 1 Application topically 2 (two) times daily. 03/28/23  Yes Radford Pax, NP  omeprazole (PRILOSEC) 20 MG capsule Take 20 mg by mouth daily as needed.    Yes [provider]  Semaglutide, 2 MG/DOSE, 8 MG/3ML SOPN Inject 2 mg as directed once a week. 12/17/22  Yes Reubin Milan, MD  sulfamethoxazole-trimethoprim (BACTRIM DS) 800-160 MG tablet Take 1 tablet by mouth 2 (two) times daily for 7 days. 03/28/23 04/04/23 Yes Radford Pax, NP    Family History Family History  Problem Relation Age of Onset   Healthy Mother    Anxiety disorder Mother    Alcoholism Mother    Healthy Father    Breast cancer Maternal Grandmother     Social History Social History   Tobacco Use   Smoking status:  Never   Smokeless tobacco: Never  Vaping Use   Vaping Use: Never used  Substance Use Topics   Alcohol use: No   Drug use: No     Allergies   Patient has no known allergies.   Review of Systems Review of Systems  Skin:  Positive for wound.     Physical Exam Triage Vital Signs ED Triage Vitals  Enc Vitals Group     BP 03/28/23 0855 (!) 140/94     Pulse Rate 03/28/23 0855 (!) 116     Resp 03/28/23 0855 18     Temp 03/28/23 0855 98.3 F (36.8 C)     Temp Source 03/28/23 0855 Oral     SpO2 03/28/23 0855 95 %     Weight 03/28/23 0855 (!) 387 lb (175.5 kg)     Height 03/28/23 0855 5\' 5"  (1.651 m)     Head Circumference --      Peak Flow --      Pain Score 03/28/23 0858 0     Pain Loc --      Pain Edu? --      Excl.  in GC? --    No data found.  Updated Vital Signs BP (!) 140/94 (BP Location: Left Arm)   Pulse (!) 116   Temp 98.3 F (36.8 C) (Oral)   Resp 18   Ht 5\' 5"  (1.651 m)   Wt (!) 387 lb (175.5 kg)   SpO2 95%   BMI 64.40 kg/m   Visual Acuity Right Eye Distance:   Left Eye Distance:   Bilateral Distance:    Right Eye Near:   Left Eye Near:    Bilateral Near:     Physical Exam Vitals and nursing note reviewed.  Constitutional:      Appearance: Normal appearance.  HENT:     Head: Normocephalic and atraumatic.  Eyes:     Pupils: Pupils are equal, round, and reactive to light.  Cardiovascular:     Rate and Rhythm: Normal rate.  Pulmonary:     Effort: Pulmonary effort is normal.  Skin:    General: Skin is warm and dry.          Comments: Subcutaneous abscess measuring 3 cm x 1.5cm  Induration with mild center fluctuance.  No active drainage.  Neurological:     General: No focal deficit present.     Mental Status: He is alert and oriented to person, place, and time.  Psychiatric:        Mood and Affect: Mood normal.        Behavior: Behavior normal.      UC Treatments / Results  Labs (all labs ordered are listed, but only abnormal results are displayed) Labs Reviewed - No data to display  Comprehensive metabolic panel Order: [id number] Status: Final result     Visible to patient: Yes (seen)     Next appt: 06/19/2023 at 04:00 PM in Internal Medicine Bari Edward, MD)     Dx: Type II diabetes mellitus with compli...   4 Result Notes     1 Patient Communication     1 HM Topic          Component Ref Range & Units 5 mo ago (10/01/22) 1 yr ago (07/25/21) 2 yr ago (07/25/20) 2 yr ago (04/14/20) 2 yr ago (03/31/20) 3 yr ago (12/01/19) 3 yr ago (07/24/19)  Glucose 70 - 99 mg/dL 696 High  295 High  CM 95 R  126 High  R 321 High  R 164 High  R  BUN 6 - 20 mg/dL 9 10 10  12 9 8   Creatinine, Ser 0.76 - 1.27 mg/dL 8.11 9.14 7.82  9.56 2.13 0.99  eGFR >59  mL/min/1.73 82 100       BUN/Creatinine Ratio 9 - 20 8 Low  10 9  12 9 8  Low   Sodium 134 - 144 mmol/L 140 140 138  136 136 137  Potassium 3.5 - 5.2 mmol/L 3.8 4.4 4.1  4.1 4.5 4.1  Chloride 96 - 106 mmol/L 100 99 100  98 95 Low  99  CO2 20 - 29 mmol/L 24 24 26  25 25 25   Calcium 8.7 - 10.2 mg/dL 9.9 08.6 9.7  57.8 46.9 High  9.7  Total Protein 6.0 - 8.5 g/dL 8.3 7.6 7.2 7.5   7.4  Albumin 4.1 - 5.1 g/dL 4.4 4.7 R 3.9 Low  R 4.3 R   4.3 R  Globulin, Total 1.5 - 4.5 g/dL 3.9 2.9 3.3    3.1  Albumin/Globulin Ratio 1.2 - 2.2 1.1 Low  1.6 1.2    1.4  Bilirubin Total 0.0 - 1.2 mg/dL 0.6 0.5 0.4 0.5   0.5  Alkaline Phosphatase 44 - 121 IU/L 81 85 68 CM 69 R   80 R  AST 0 - 40 IU/L 112 High  51 High  25 78 High    93 High   ALT 0 - 44 IU/L 101 High  64 High  19 92 High    104 High   Resulting Agency [facility]         Narrative Performed by: Verdell Carmine Performed at:  [address] [facility] [address] [place], [place]  [id number] Lab Director: Jolene Schimke MD, Phone:  [phone]    Specimen Collected: 10/01/22 13:57 Last Resulted: 10/02/22 16:36        EKG   Radiology No results found.  Procedures Incision and Drainage  Date/Time: 03/28/2023 9:32 AM  Performed by: Radford Pax, NP Authorized by: Radford Pax, NP   Consent:    Consent obtained:  Verbal   Consent given by:  Patient   Risks discussed:  Bleeding, incomplete drainage, pain and infection   Alternatives discussed:  Alternative treatment Universal protocol:    Patient identity confirmed:  Verbally with patient Location:    Type:  Abscess   Size:  3cmx1.5cm   Location:  Trunk   Trunk location:  Back Pre-procedure details:    Skin preparation:  Chlorhexidine Sedation:    Sedation type:  None Anesthesia:    Anesthesia method:  Local infiltration   Local anesthetic:  Lidocaine 1% WITH epi Procedure type:    Complexity:  Simple Procedure  details:    Ultrasound guidance: no     Needle aspiration: no     Incision types:  Stab incision   Incision depth:  Subcutaneous   Drainage:  Serosanguinous   Drainage amount:  Scant   Wound treatment:  Wound left open   Packing materials:  None Post-procedure details:    Procedure completion:  Tolerated well, no immediate complications  (including critical care time)  Medications Ordered in UC Medications - No data to display  Initial Impression / Assessment and Plan / UC Course  I have reviewed the triage vital signs and the nursing notes.  Pertinent labs & imaging results that were available during my care of the patient were reviewed by me  and considered in my medical decision making (see chart for details).     Wound care reviewed.  Start Bactrim and mupirocin PCP follow-up if symptoms do not improve ER precautions reviewed and patient verbalized understanding Final Clinical Impressions(s) / UC Diagnoses   Final diagnoses:  Abscess     Discharge Instructions      Start Bactrim twice daily for 7 days.  Apply mupirocin antibiotic ointment topically twice daily for 7 days as well.  Keep area covered.  Use warm compresses to encourage the area to drain Follow-up with your PCP if your symptoms do not improve Please go to the ER for any worsening symptoms I hope you feel better soon!   ED Prescriptions     Medication Sig Dispense Auth. Provider   sulfamethoxazole-trimethoprim (BACTRIM DS) 800-160 MG tablet Take 1 tablet by mouth 2 (two) times daily for 7 days. 14 tablet Radford Pax, NP   mupirocin cream (BACTROBAN) 2 % Apply 1 Application topically 2 (two) times daily. 15 g Radford Pax, NP      PDMP not reviewed this encounter.   Radford Pax, NP 03/28/23 0933    Radford Pax, NP 03/28/23 [phone]

## 2023-04-17 LAB — HM DIABETES EYE EXAM

## 2023-06-16 ENCOUNTER — Other Ambulatory Visit: Payer: Self-pay | Admitting: Internal Medicine

## 2023-06-16 DIAGNOSIS — E118 Type 2 diabetes mellitus with unspecified complications: Secondary | ICD-10-CM

## 2023-06-19 ENCOUNTER — Ambulatory Visit: Payer: BC Managed Care – PPO | Admitting: Internal Medicine

## 2023-07-10 ENCOUNTER — Other Ambulatory Visit: Payer: Self-pay

## 2023-07-10 ENCOUNTER — Ambulatory Visit: Payer: BC Managed Care – PPO | Admitting: Internal Medicine

## 2023-07-10 DIAGNOSIS — E118 Type 2 diabetes mellitus with unspecified complications: Secondary | ICD-10-CM

## 2023-07-12 ENCOUNTER — Encounter: Payer: Self-pay | Admitting: Internal Medicine

## 2023-07-12 ENCOUNTER — Ambulatory Visit: Payer: BC Managed Care – PPO | Admitting: Internal Medicine

## 2023-07-12 VITALS — BP 125/86 | HR 95 | Ht 65.0 in | Wt 381.8 lb

## 2023-07-12 DIAGNOSIS — Z7984 Long term (current) use of oral hypoglycemic drugs: Secondary | ICD-10-CM

## 2023-07-12 DIAGNOSIS — Z23 Encounter for immunization: Secondary | ICD-10-CM | POA: Diagnosis not present

## 2023-07-12 DIAGNOSIS — I10 Essential (primary) hypertension: Secondary | ICD-10-CM | POA: Diagnosis not present

## 2023-07-12 DIAGNOSIS — E118 Type 2 diabetes mellitus with unspecified complications: Secondary | ICD-10-CM | POA: Diagnosis not present

## 2023-07-12 DIAGNOSIS — E66813 Obesity, class 3: Secondary | ICD-10-CM

## 2023-07-12 DIAGNOSIS — Z6841 Body Mass Index (BMI) 40.0 and over, adult: Secondary | ICD-10-CM

## 2023-07-12 MED ORDER — DEXCOM G7 SENSOR MISC: 1.0000 | 5 refills | Status: AC

## 2023-07-12 MED ORDER — SEMAGLUTIDE (2 MG/DOSE) 8 MG/3ML ~~LOC~~ SOPN
2.0000 mg | PEN_INJECTOR | SUBCUTANEOUS | 3 refills | Status: DC
Start: 2023-07-12 — End: 2024-05-08

## 2023-07-12 NOTE — Assessment & Plan Note (Signed)
Normal exam with stable BP on lisinopril and hctz. No concerns or side effects to current medication. No change in regimen; continue low sodium diet.

## 2023-07-12 NOTE — Assessment & Plan Note (Addendum)
BS not well controlled despite 4 agents. Highest BS 226 on Libre but no longer able to get it. Will refer to Endocrinology. Sample and Rx for Dexcom G7  Lab Results  Component Value Date   HGBA1C 10.8 (H) 03/19/2023

## 2023-07-12 NOTE — Progress Notes (Signed)
Date:  07/12/2023   Name:  Elijah Wells   DOB:  1991-05-28   MRN:  [id number]   Chief Complaint: Diabetes  Diabetes He presents for his follow-up diabetic visit. He has type 2 diabetes mellitus. Pertinent negatives for hypoglycemia include no dizziness or headaches. Pertinent negatives for diabetes include no chest pain, no fatigue and no weakness. Symptoms are worsening. Current diabetic treatments: Janumet, Farxiga, Ozempic. His weight is stable.  Hypertension This is a chronic problem. The problem is controlled. Pertinent negatives include no chest pain, headaches, palpitations or shortness of breath. Past treatments include ACE inhibitors and diuretics. The current treatment provides significant improvement.    Lab Results  Component Value Date   NA 140 10/01/2022   K 3.8 10/01/2022   CO2 24 10/01/2022   GLUCOSE 166 (H) 10/01/2022   BUN 9 10/01/2022   CREATININE 1.20 10/01/2022   CALCIUM 9.9 10/01/2022   EGFR 82 10/01/2022   GFRNONAA 90 07/25/2020   Lab Results  Component Value Date   CHOL 130 07/25/2021   HDL 35 (L) 07/25/2021   LDLCALC 79 07/25/2021   TRIG 81 07/25/2021   CHOLHDL 3.7 07/25/2021   Lab Results  Component Value Date   TSH 1.700 07/25/2020   Lab Results  Component Value Date   HGBA1C 10.8 (H) 03/19/2023   Lab Results  Component Value Date   WBC 6.4 10/01/2022   HGB 15.1 10/01/2022   HCT 47.2 10/01/2022   MCV 81 10/01/2022   PLT 282 10/01/2022   Lab Results  Component Value Date   ALT 101 (H) 10/01/2022   AST 112 (H) 10/01/2022   ALKPHOS 81 10/01/2022   BILITOT 0.6 10/01/2022   Lab Results  Component Value Date   VD25OH 23.6 (L) 04/14/2020     Review of Systems  Constitutional:  Negative for fatigue and unexpected weight change.  HENT:  Negative for nosebleeds.   Eyes:  Negative for visual disturbance.  Respiratory:  Negative for cough, chest tightness, shortness of breath and wheezing.   Cardiovascular:  Negative for chest  pain, palpitations and leg swelling.  Gastrointestinal:  Negative for abdominal pain, constipation and diarrhea.  Neurological:  Negative for dizziness, weakness, light-headedness and headaches.    Patient Active Problem List   Diagnosis Date Noted   Abnormal liver enzymes 01/29/2022   Vitamin D deficiency 06/23/2020   Class 3 severe obesity with serious comorbidity and body mass index (BMI) of 50.0 to 59.9 in adult [facility]) 06/23/2020   Type II diabetes mellitus with complication (HCC) 09/20/2017   Essential hypertension 09/20/2017   Gastroesophageal reflux disease 09/20/2017    No Known Allergies  Past Surgical History:  Procedure Laterality Date   none     WISDOM TOOTH EXTRACTION  01/2019    Social History   Tobacco Use   Smoking status: Never   Smokeless tobacco: Never  Vaping Use   Vaping status: Never Used  Substance Use Topics   Alcohol use: No   Drug use: No     Medication list has been reviewed and updated.  Current Meds  Medication Sig   Cholecalciferol (VITAMIN D3) 1.25 MG (50000 UT) CAPS Take 1 capsule by mouth once a week   Continuous Glucose Sensor (DEXCOM G7 SENSOR) MISC 1 each by Does not apply route continuous.   dapagliflozin propanediol (FARXIGA) 10 MG TABS tablet Take 1 tablet (10 mg total) by mouth daily before breakfast.   hydrochlorothiazide (HYDRODIURIL) 25 MG tablet TAKE 1 TABLET  DAILY   Insulin Pen Needle (PEN NEEDLES) 32G X 5 MM MISC 1 each by Does not apply route once a week.   JANUMET 50-1000 MG tablet TAKE 1 TABLET BY MOUTH TWICE DAILY WITH A MEAL   lisinopril (ZESTRIL) 20 MG tablet TAKE 1 TABLET DAILY   mupirocin cream (BACTROBAN) 2 % Apply 1 Application topically 2 (two) times daily.   omeprazole (PRILOSEC) 20 MG capsule Take 20 mg by mouth daily as needed.    [DISCONTINUED] Continuous Glucose Sensor (DEXCOM G6 SENSOR) MISC 1 each by Does not apply route continuous.   [DISCONTINUED] Semaglutide, 2 MG/DOSE, 8 MG/3ML SOPN Inject 2 mg as  directed once a week.       07/12/2023    3:42 PM 03/19/2023    2:47 PM 12/17/2022    3:10 PM 10/01/2022    1:17 PM  GAD 7 : Generalized Anxiety Score  Nervous, Anxious, on Edge 1 1 0 0  Control/stop worrying 0 0 0 0  Worry too much - different things 1 1 1  0  Trouble relaxing 0 0 0 0  Restless 0 0 0 0  Easily annoyed or irritable 0 0 0 0  Afraid - awful might happen 1 0 0 0  Total GAD 7 Score 3 2 1  0  Anxiety Difficulty Not difficult at all Not difficult at all Not difficult at all Not difficult at all       07/12/2023    3:42 PM 03/19/2023    2:47 PM 12/17/2022    3:10 PM  Depression screen PHQ 2/9  Decreased Interest 1 0 1  Down, Depressed, Hopeless 0 0 0  PHQ - 2 Score 1 0 1  Altered sleeping 1 0 0  Tired, decreased energy 0 1 0  Change in appetite 1 1 1   Feeling bad or failure about yourself  1 0 0  Trouble concentrating 0 1 0  Moving slowly or fidgety/restless 1 0 0  Suicidal thoughts 0 0 0  PHQ-9 Score 5 3 2   Difficult doing work/chores Not difficult at all Not difficult at all Not difficult at all    BP Readings from Last 3 Encounters:  07/12/23 125/86  03/28/23 (!) 140/94  03/19/23 128/76    Physical Exam Vitals and nursing note reviewed.  Constitutional:      General: He is not in acute distress.    Appearance: He is well-developed.  HENT:     Head: Normocephalic and atraumatic.  Neck:     Vascular: No carotid bruit.  Cardiovascular:     Rate and Rhythm: Normal rate and regular rhythm.     Pulses: Normal pulses.     Heart sounds: No murmur heard. Pulmonary:     Effort: Pulmonary effort is normal. No respiratory distress.     Breath sounds: No wheezing or rhonchi.  Musculoskeletal:     Right lower leg: No edema.     Left lower leg: No edema.  Lymphadenopathy:     Cervical: No cervical adenopathy.  Skin:    General: Skin is warm and dry.     Findings: No rash.  Neurological:     Mental Status: He is alert and oriented to person, place, and time.   Psychiatric:        Mood and Affect: Mood normal.        Behavior: Behavior normal.    Diabetic Foot Exam - Simple   Simple Foot Form Diabetic Foot exam was performed with the following findings:  Yes 07/12/2023  3:56 PM  Visual Inspection No deformities, no ulcerations, no other skin breakdown bilaterally: Yes Sensation Testing Intact to touch and monofilament testing bilaterally: Yes Pulse Check Posterior Tibialis and Dorsalis pulse intact bilaterally: Yes Comments      Wt Readings from Last 3 Encounters:  07/12/23 (!) 381 lb 12.8 oz (173.2 kg)  03/28/23 (!) 387 lb (175.5 kg)  03/19/23 (!) 387 lb (175.5 kg)    BP 125/86 (BP Location: Right Arm, Cuff Size: Large)   Pulse 95   Ht 5\' 5"  (1.651 m)   Wt (!) 381 lb 12.8 oz (173.2 kg)   SpO2 97%   BMI 63.53 kg/m   Assessment and Plan:  Problem List Items Addressed This Visit       Unprioritized   Class 3 severe obesity with serious comorbidity and body mass index (BMI) of 50.0 to 59.9 in adult [facility])   Relevant Medications   Semaglutide, 2 MG/DOSE, 8 MG/3ML SOPN   Essential hypertension (Chronic)    Normal exam with stable BP on lisinopril and hctz. No concerns or side effects to current medication. No change in regimen; continue low sodium diet.       Relevant Orders   Comprehensive metabolic panel   Type II diabetes mellitus with complication (HCC) - Primary    BS not well controlled despite 4 agents. Highest BS 226 on Libre but no longer able to get it. Will refer to Endocrinology. Sample and Rx for Dexcom G7  Lab Results  Component Value Date   HGBA1C 10.8 (H) 03/19/2023        Relevant Medications   Semaglutide, 2 MG/DOSE, 8 MG/3ML SOPN   Continuous Glucose Sensor (DEXCOM G7 SENSOR) MISC   Other Relevant Orders   Hemoglobin A1c   Microalbumin / creatinine urine ratio   Lipid panel   Comprehensive metabolic panel   Other Visit Diagnoses     Long term current use of oral hypoglycemic drug        Need for influenza vaccination       Relevant Orders   Flu vaccine trivalent PF, 6mos and older(Flulaval,Afluria,Fluarix,Fluzone) (Completed)       Return in about 4 months (around 11/11/2023) for CPX.    Reubin Milan, MD [facility] Medicine [place]

## 2023-07-16 ENCOUNTER — Encounter: Payer: Self-pay | Admitting: Internal Medicine

## 2023-07-18 ENCOUNTER — Telehealth: Payer: Self-pay

## 2023-07-18 NOTE — Telephone Encounter (Signed)
Sent pt a message on Mychart.  KP

## 2023-07-18 NOTE — Telephone Encounter (Signed)
-----   Message from Bari Edward sent at 07/18/2023  8:21 AM EDT ----- Please call [name] and remind him not to wait to get his labs drawn. ----- Message ----- From: SYSTEM Sent: 07/17/2023  12:17 AM EDT To: Reubin Milan, MD

## 2023-07-26 ENCOUNTER — Other Ambulatory Visit: Payer: Self-pay | Admitting: Internal Medicine

## 2023-07-26 DIAGNOSIS — I10 Essential (primary) hypertension: Secondary | ICD-10-CM

## 2023-09-09 ENCOUNTER — Other Ambulatory Visit: Payer: Self-pay | Admitting: Internal Medicine

## 2023-09-09 DIAGNOSIS — E118 Type 2 diabetes mellitus with unspecified complications: Secondary | ICD-10-CM

## 2023-09-09 NOTE — Telephone Encounter (Signed)
Requested Prescriptions  Pending Prescriptions Disp Refills   FARXIGA 10 MG TABS tablet [Pharmacy Med Name: Farxiga 10 MG Oral Tablet] 90 tablet 1    Sig: TAKE 1 TABLET BY MOUTH ONCE DAILY BEFORE BREAKFAST     Endocrinology:  Diabetes - SGLT2 Inhibitors Failed - 09/09/2023  6:52 AM      Failed - HBA1C is between 0 and 7.9 and within 180 days    Hemoglobin A1C  Date Value Ref Range Status  03/19/2023 9.7 (A) 4.0 - 5.6 % Final  10/22/2013 14.0 (H) 4.2 - 6.3 % Final    Comment:    The American Diabetes Association recommends that a primary goal of therapy should be <7% and that physicians should reevaluate the treatment regimen in patients with HbA1c values consistently >8%.    Hgb A1c MFr Bld  Date Value Ref Range Status  03/19/2023 10.8 (H) 4.8 - 5.6 % Final    Comment:             Prediabetes: 5.7 - 6.4          Diabetes: >6.4          Glycemic control for adults with diabetes: <7.0          Passed - Cr in normal range and within 360 days    Creatinine  Date Value Ref Range Status  10/22/2013 1.45 (H) 0.60 - 1.30 mg/dL Final   Creatinine, Ser  Date Value Ref Range Status  10/01/2022 1.20 0.76 - 1.27 mg/dL Final         Passed - eGFR in normal range and within 360 days    EGFR (African American)  Date Value Ref Range Status  10/22/2013 >60  Final   GFR calc Af Amer  Date Value Ref Range Status  07/25/2020 104 >59 mL/min/1.73 Final    Comment:    **[facility] currently reports eGFR in compliance with the current**   recommendations of the [employer]. [facility] will   update reporting as new guidelines are published from the NKF-ASN   Task force.    EGFR (Non-African Amer.)  Date Value Ref Range Status  10/22/2013 >60  Final    Comment:    eGFR values <73mL/min/1.73 m2 may be an indication of chronic kidney disease (CKD). Calculated eGFR is useful in patients with stable renal function. The eGFR calculation will not be reliable in acutely ill  patients when serum creatinine is changing rapidly. It is not useful in  patients on dialysis. The eGFR calculation may not be applicable to patients at the low and high extremes of body sizes, pregnant women, and vegetarians. GLUCOSE - RESULTS VERIFIED BY REPEAT TESTING.  - NOTIFIED OF CRITICAL VALUE  - CALLED [name] 1315 10-22-13  - GAS  - READ-BACK PROCESS PERFORMED.    GFR calc non Af Amer  Date Value Ref Range Status  07/25/2020 90 >59 mL/min/1.73 Final   eGFR  Date Value Ref Range Status  10/01/2022 82 >59 mL/min/1.73 Final         Passed - Valid encounter within last 6 months    Recent Outpatient Visits           1 month ago Type II diabetes mellitus with complication [facility])    Primary Care & Sports Medicine at [facility], Nyoka Cowden, MD   5 months ago Type II diabetes mellitus with complication [facility])   [facility] at [facility], Nyoka Cowden,  MD   8 months ago Type II diabetes mellitus with complication [facility])   [place] Primary Care & Sports Medicine at [facility], Nyoka Cowden, MD   11 months ago Type II diabetes mellitus with complication [facility])   [place] Primary Care & Sports Medicine at [facility], Nyoka Cowden, MD   1 year ago Type II diabetes mellitus with complication [facility])   [place] Primary Care & Sports Medicine at [facility], Nyoka Cowden, MD       Future Appointments             In 4 months Judithann Graves, Nyoka Cowden, MD [facility] Primary [facility] Medicine at [facility], [facility]

## 2023-09-24 ENCOUNTER — Other Ambulatory Visit: Payer: Self-pay | Admitting: Internal Medicine

## 2023-09-24 DIAGNOSIS — E118 Type 2 diabetes mellitus with unspecified complications: Secondary | ICD-10-CM

## 2023-09-25 MED ORDER — JANUMET 50-1000 MG PO TABS: 1.0000 | ORAL_TABLET | Freq: Two times a day (BID) | ORAL | 0 refills | Status: DC

## 2023-12-15 ENCOUNTER — Other Ambulatory Visit: Payer: Self-pay | Admitting: Internal Medicine

## 2023-12-15 DIAGNOSIS — E118 Type 2 diabetes mellitus with unspecified complications: Secondary | ICD-10-CM

## 2023-12-17 NOTE — Telephone Encounter (Signed)
 Requested medication (s) are due for refill today:   Yes  Requested medication (s) are on the active medication list:   Yes  Future visit scheduled:   Yes 3/27;     LOV 07/12/2023   Last ordered: 09/25/2023 #180, 0 refills  Unable to refill because all labs are due.   Requested Prescriptions  Pending Prescriptions Disp Refills   JANUMET 50-1000 MG tablet [Pharmacy Med Name: Janumet 50-1000 MG Oral Tablet] 180 tablet 0    Sig: TAKE 1 TABLET BY MOUTH TWICE DAILY WITH A MEAL     Endocrinology:  Diabetes - Biguanide + DPP-4 Inhibitor Combos Failed - 12/17/2023  8:25 AM      Failed - HBA1C is between 0 and 7.9 and within 180 days    Hemoglobin A1C  Date Value Ref Range Status  03/19/2023 9.7 (A) 4.0 - 5.6 % Final  10/22/2013 14.0 (H) 4.2 - 6.3 % Final    Comment:    The American Diabetes Association recommends that a primary goal of therapy should be <7% and that physicians should reevaluate the treatment regimen in patients with HbA1c values consistently >8%.    Hgb A1c MFr Bld  Date Value Ref Range Status  03/19/2023 10.8 (H) 4.8 - 5.6 % Final    Comment:             Prediabetes: 5.7 - 6.4          Diabetes: >6.4          Glycemic control for adults with diabetes: <7.0          Failed - Cr in normal range and within 360 days    Creatinine  Date Value Ref Range Status  10/22/2013 1.45 (H) 0.60 - 1.30 mg/dL Final   Creatinine, Ser  Date Value Ref Range Status  10/01/2022 1.20 0.76 - 1.27 mg/dL Final         Failed - eGFR in normal range and within 360 days    EGFR (African American)  Date Value Ref Range Status  10/22/2013 >60  Final   GFR calc Af Amer  Date Value Ref Range Status  07/25/2020 104 >59 mL/min/1.73 Final    Comment:    **[facility] currently reports eGFR in compliance with the current**   recommendations of the [employer]. [facility] will   update reporting as new guidelines are published from the NKF-ASN   Task force.    EGFR  (Non-African Amer.)  Date Value Ref Range Status  10/22/2013 >60  Final    Comment:    eGFR values <27mL/min/1.73 m2 may be an indication of chronic kidney disease (CKD). Calculated eGFR is useful in patients with stable renal function. The eGFR calculation will not be reliable in acutely ill patients when serum creatinine is changing rapidly. It is not useful in  patients on dialysis. The eGFR calculation may not be applicable to patients at the low and high extremes of body sizes, pregnant women, and vegetarians. GLUCOSE - RESULTS VERIFIED BY REPEAT TESTING.  - NOTIFIED OF CRITICAL VALUE  - CALLED [name] 1315 10-22-13  - GAS  - READ-BACK PROCESS PERFORMED.    GFR calc non Af Amer  Date Value Ref Range Status  07/25/2020 90 >59 mL/min/1.73 Final   eGFR  Date Value Ref Range Status  10/01/2022 82 >59 mL/min/1.73 Final         Failed - B12 Level in normal range and within 720 days    Vitamin B-12  Date Value Ref Range Status  04/14/2020 355 232 - 1,245 pg/mL Final         Failed - CBC within normal limits and completed in the last 12 months    WBC  Date Value Ref Range Status  10/01/2022 6.4 3.4 - 10.8 x10E3/uL Final  10/22/2013 10.1 3.8 - 10.6 x10 3/mm 3 Final   RBC  Date Value Ref Range Status  10/01/2022 5.84 (H) 4.14 - 5.80 x10E6/uL Final  10/22/2013 5.91 (H) 4.40 - 5.90 x10 6/mm 3 Final   Hemoglobin  Date Value Ref Range Status  10/01/2022 15.1 13.0 - 17.7 g/dL Final   Hematocrit  Date Value Ref Range Status  10/01/2022 47.2 37.5 - 51.0 % Final   MCHC  Date Value Ref Range Status  10/01/2022 32.0 31.5 - 35.7 g/dL Final  16/07/9603 54.0 32.0 - 36.0 g/dL Final   [facility]  Date Value Ref Range Status  10/01/2022 25.9 (L) 26.6 - 33.0 pg Final  10/22/2013 27.2 26.0 - 34.0 pg Final   MCV  Date Value Ref Range Status  10/01/2022 81 79 - 97 fL Final  10/22/2013 82 80 - 100 fL Final   No results found for: "PLTCOUNTKUC", "LABPLAT", "POCPLA" RDW  Date  Value Ref Range Status  10/01/2022 15.4 11.6 - 15.4 % Final  10/22/2013 14.5 11.5 - 14.5 % Final         Passed - Valid encounter within last 6 months    Recent Outpatient Visits           5 months ago Type II diabetes mellitus with complication (HCC)   [place] Primary Care & Sports Medicine at [facility], Nyoka Cowden, MD   9 months ago Type II diabetes mellitus with complication [facility])   [place] Primary Care & Sports Medicine at [facility], Nyoka Cowden, MD   1 year ago Type II diabetes mellitus with complication [facility])   [place] Primary Care & Sports Medicine at [facility], Nyoka Cowden, MD   1 year ago Type II diabetes mellitus with complication [facility])   [place] Primary Care & Sports Medicine at [facility], Nyoka Cowden, MD   1 year ago Type II diabetes mellitus with complication [facility])   [place] Primary Care & Sports Medicine at [facility], Nyoka Cowden, MD       Future Appointments             In 3 weeks Judithann Graves Nyoka Cowden, MD [facility] Primary Care & Sports Medicine at [facility], [facility]

## 2023-12-19 ENCOUNTER — Other Ambulatory Visit: Payer: Self-pay | Admitting: Internal Medicine

## 2023-12-19 DIAGNOSIS — I10 Essential (primary) hypertension: Secondary | ICD-10-CM

## 2023-12-20 NOTE — Telephone Encounter (Signed)
 Request too soon, last ordered 07/28/23 #90, 1 refill Requested Prescriptions  Pending Prescriptions Disp Refills   hydrochlorothiazide (HYDRODIURIL) 25 MG tablet [Pharmacy Med Name: HYDROCHLOROT TAB 25MG ] 90 tablet 1    Sig: TAKE 1 TABLET DAILY     Cardiovascular: Diuretics - Thiazide Failed - 12/20/2023 11:16 AM      Failed - Cr in normal range and within 180 days    Creatinine  Date Value Ref Range Status  10/22/2013 1.45 (H) 0.60 - 1.30 mg/dL Final   Creatinine, Ser  Date Value Ref Range Status  10/01/2022 1.20 0.76 - 1.27 mg/dL Final         Failed - K in normal range and within 180 days    Potassium  Date Value Ref Range Status  10/01/2022 3.8 3.5 - 5.2 mmol/L Final  10/22/2013 4.2 3.5 - 5.1 mmol/L Final         Failed - Na in normal range and within 180 days    Sodium  Date Value Ref Range Status  10/01/2022 140 134 - 144 mmol/L Final  10/22/2013 129 (L) 136 - 145 mmol/L Final         Passed - Last BP in normal range    BP Readings from Last 1 Encounters:  07/12/23 125/86         Passed - Valid encounter within last 6 months    Recent Outpatient Visits           5 months ago Type II diabetes mellitus with complication [facility])   [place] Primary Care & Sports Medicine at [facility], Nyoka Cowden, MD   9 months ago Type II diabetes mellitus with complication [facility])   [place] Primary Care & Sports Medicine at [facility], Nyoka Cowden, MD   1 year ago Type II diabetes mellitus with complication [facility])   [place] Primary Care & Sports Medicine at [facility], Nyoka Cowden, MD   1 year ago Type II diabetes mellitus with complication [facility])   [place] Primary Care & Sports Medicine at [facility], Nyoka Cowden, MD   1 year ago Type II diabetes mellitus with complication [facility])   [place] Primary Care & Sports Medicine at [facility], Nyoka Cowden, MD       Future Appointments             In 2 weeks  Judithann Graves Nyoka Cowden, MD [facility] Primary Care & Sports Medicine at [facility] [place], [facility]             lisinopril (ZESTRIL) 20 MG tablet [Pharmacy Med Name: LISINOPRIL TAB 20MG ] 90 tablet 1    Sig: TAKE 1 TABLET DAILY     Cardiovascular:  ACE Inhibitors Failed - 12/20/2023 11:16 AM      Failed - Cr in normal range and within 180 days    Creatinine  Date Value Ref Range Status  10/22/2013 1.45 (H) 0.60 - 1.30 mg/dL Final   Creatinine, Ser  Date Value Ref Range Status  10/01/2022 1.20 0.76 - 1.27 mg/dL Final         Failed - K in normal range and within 180 days    Potassium  Date Value Ref Range Status  10/01/2022 3.8 3.5 - 5.2 mmol/L Final  10/22/2013 4.2 3.5 - 5.1 mmol/L Final         Passed - Patient is not pregnant      Passed - Last BP in normal range  BP Readings from Last 1 Encounters:  07/12/23 125/86         Passed - Valid encounter within last 6 months    Recent Outpatient Visits           5 months ago Type II diabetes mellitus with complication [facility])   [place] Primary Care & Sports Medicine at [facility], Nyoka Cowden, MD   9 months ago Type II diabetes mellitus with complication [facility])   [place] Primary Care & Sports Medicine at [facility], Nyoka Cowden, MD   1 year ago Type II diabetes mellitus with complication [facility])   [place] Primary Care & Sports Medicine at [facility], Nyoka Cowden, MD   1 year ago Type II diabetes mellitus with complication [facility])   [place] Primary Care & Sports Medicine at [facility], Nyoka Cowden, MD   1 year ago Type II diabetes mellitus with complication [facility])   [place] Primary Care & Sports Medicine at [facility], Nyoka Cowden, MD       Future Appointments             In 2 weeks Judithann Graves Nyoka Cowden, MD [facility] Primary Care & Sports Medicine at [facility], [facility]

## 2024-01-09 ENCOUNTER — Encounter: Payer: Self-pay | Admitting: Internal Medicine

## 2024-03-11 ENCOUNTER — Other Ambulatory Visit: Payer: Self-pay | Admitting: Internal Medicine

## 2024-03-11 DIAGNOSIS — E118 Type 2 diabetes mellitus with unspecified complications: Secondary | ICD-10-CM

## 2024-03-13 ENCOUNTER — Other Ambulatory Visit: Payer: Self-pay

## 2024-03-13 NOTE — Telephone Encounter (Signed)
 Requested medications are due for refill today.  yes  Requested medications are on the active medications list.  yes  Last refill. 09/09/2023 #90 1 rf  Future visit scheduled.   No - pt cancelled last appt.  Notes to clinic.  Labs are expired.    Requested Prescriptions  Pending Prescriptions Disp Refills   FARXIGA  10 MG TABS tablet [Pharmacy Med Name: Farxiga  10 MG Oral Tablet] 90 tablet 0    Sig: TAKE 1 TABLET BY MOUTH ONCE DAILY BEFORE BREAKFAST     Endocrinology:  Diabetes - SGLT2 Inhibitors Failed - 03/13/2024 11:25 AM      Failed - Cr in normal range and within 360 days    Creatinine  Date Value Ref Range Status  10/22/2013 1.45 (H) 0.60 - 1.30 mg/dL Final   Creatinine, Ser  Date Value Ref Range Status  10/01/2022 1.20 0.76 - 1.27 mg/dL Final         Failed - HBA1C is between 0 and 7.9 and within 180 days    Hemoglobin A1C  Date Value Ref Range Status  03/19/2023 9.7 (A) 4.0 - 5.6 % Final  10/22/2013 14.0 (H) 4.2 - 6.3 % Final    Comment:    The American Diabetes Association recommends that a primary goal of therapy should be <7% and that physicians should reevaluate the treatment regimen in patients with HbA1c values consistently >8%.    Hgb A1c MFr Bld  Date Value Ref Range Status  03/19/2023 10.8 (H) 4.8 - 5.6 % Final    Comment:             Prediabetes: 5.7 - 6.4          Diabetes: >6.4          Glycemic control for adults with diabetes: <7.0          Failed - eGFR in normal range and within 360 days    EGFR (African American)  Date Value Ref Range Status  10/22/2013 >60  Final   GFR calc Af Amer  Date Value Ref Range Status  07/25/2020 104 >59 mL/min/1.73 Final    Comment:    **[facility] currently reports eGFR in compliance with the current**   recommendations of the [employer]. [facility] will   update reporting as new guidelines are published from the NKF-ASN   Task force.    EGFR (Non-African Amer.)  Date Value Ref Range Status   10/22/2013 >60  Final    Comment:    eGFR values <38mL/min/1.73 m2 may be an indication of chronic kidney disease (CKD). Calculated eGFR is useful in patients with stable renal function. The eGFR calculation will not be reliable in acutely ill patients when serum creatinine is changing rapidly. It is not useful in  patients on dialysis. The eGFR calculation may not be applicable to patients at the low and high extremes of body sizes, pregnant women, and vegetarians. GLUCOSE - RESULTS VERIFIED BY REPEAT TESTING.  - NOTIFIED OF CRITICAL VALUE  - CALLED Elijah Wells 1315 10-22-13  - GAS  - READ-BACK PROCESS PERFORMED.    GFR calc non Af Amer  Date Value Ref Range Status  07/25/2020 90 >59 mL/min/1.73 Final   eGFR  Date Value Ref Range Status  10/01/2022 82 >59 mL/min/1.73 Final         Failed - Valid encounter within last 6 months    Recent Outpatient Visits   None

## 2024-04-05 ENCOUNTER — Other Ambulatory Visit: Payer: Self-pay | Admitting: Internal Medicine

## 2024-04-05 DIAGNOSIS — E118 Type 2 diabetes mellitus with unspecified complications: Secondary | ICD-10-CM

## 2024-04-07 NOTE — Telephone Encounter (Signed)
 Requested medications are due for refill today.  yes  Requested medications are on the active medications list.  yes  Last refill. 12/17/2023 #180 0 rf  Future visit scheduled.   no  Notes to clinic.  Pt is more than 3 months overdue for OV.  Labs are expired.    Requested Prescriptions  Pending Prescriptions Disp Refills   JANUMET  50-1000 MG tablet [Pharmacy Med Name: Janumet  50-1000 MG Oral Tablet] 180 tablet 0    Sig: TAKE 1 TABLET BY MOUTH TWICE DAILY WITH A MEAL     Endocrinology:  Diabetes - Biguanide + DPP-4 Inhibitor Combos Failed - 04/07/2024  2:48 PM      Failed - HBA1C is between 0 and 7.9 and within 180 days    Hemoglobin A1C  Date Value Ref Range Status  03/19/2023 9.7 (A) 4.0 - 5.6 % Final  10/22/2013 14.0 (H) 4.2 - 6.3 % Final    Comment:    The American Diabetes Association recommends that a primary goal of therapy should be <7% and that physicians should reevaluate the treatment regimen in patients with HbA1c values consistently >8%.    Hgb A1c MFr Bld  Date Value Ref Range Status  03/19/2023 10.8 (H) 4.8 - 5.6 % Final    Comment:             Prediabetes: 5.7 - 6.4          Diabetes: >6.4          Glycemic control for adults with diabetes: <7.0          Failed - Cr in normal range and within 360 days    Creatinine  Date Value Ref Range Status  10/22/2013 1.45 (H) 0.60 - 1.30 mg/dL Final   Creatinine, Ser  Date Value Ref Range Status  10/01/2022 1.20 0.76 - 1.27 mg/dL Final         Failed - eGFR in normal range and within 360 days    EGFR (African American)  Date Value Ref Range Status  10/22/2013 >60  Final   GFR calc Af Amer  Date Value Ref Range Status  07/25/2020 104 >59 mL/min/1.73 Final    Comment:    **[facility] currently reports eGFR in compliance with the current**   recommendations of the [employer]. [facility] will   update reporting as new guidelines are published from the NKF-ASN   Task force.    EGFR (Non-African  Amer.)  Date Value Ref Range Status  10/22/2013 >60  Final    Comment:    eGFR values <47mL/min/1.73 m2 may be an indication of chronic kidney disease (CKD). Calculated eGFR is useful in patients with stable renal function. The eGFR calculation will not be reliable in acutely ill patients when serum creatinine is changing rapidly. It is not useful in  patients on dialysis. The eGFR calculation may not be applicable to patients at the low and high extremes of body sizes, pregnant women, and vegetarians. GLUCOSE - RESULTS VERIFIED BY REPEAT TESTING.  - NOTIFIED OF CRITICAL VALUE  - CALLED [name] 1315 10-22-13  - GAS  - READ-BACK PROCESS PERFORMED.    GFR calc non Af Amer  Date Value Ref Range Status  07/25/2020 90 >59 mL/min/1.73 Final   eGFR  Date Value Ref Range Status  10/01/2022 82 >59 mL/min/1.73 Final         Failed - B12 Level in normal range and within 720 days    Vitamin B-12  Date Value  Ref Range Status  04/14/2020 355 232 - 1,245 pg/mL Final         Failed - Valid encounter within last 6 months    Recent Outpatient Visits   None            Failed - CBC within normal limits and completed in the last 12 months    WBC  Date Value Ref Range Status  10/01/2022 6.4 3.4 - 10.8 x10E3/uL Final  10/22/2013 10.1 3.8 - 10.6 x10 3/mm 3 Final   RBC  Date Value Ref Range Status  10/01/2022 5.84 (H) 4.14 - 5.80 x10E6/uL Final  10/22/2013 5.91 (H) 4.40 - 5.90 x10 6/mm 3 Final   Hemoglobin  Date Value Ref Range Status  10/01/2022 15.1 13.0 - 17.7 g/dL Final   Hematocrit  Date Value Ref Range Status  10/01/2022 47.2 37.5 - 51.0 % Final   MCHC  Date Value Ref Range Status  10/01/2022 32.0 31.5 - 35.7 g/dL Final  98/91/7984 66.8 32.0 - 36.0 g/dL Final   [facility]  Date Value Ref Range Status  10/01/2022 25.9 (L) 26.6 - 33.0 pg Final  10/22/2013 27.2 26.0 - 34.0 pg Final   MCV  Date Value Ref Range Status  10/01/2022 81 79 - 97 fL Final  10/22/2013 82 80 -  100 fL Final   No results found for: PLTCOUNTKUC, LABPLAT, POCPLA RDW  Date Value Ref Range Status  10/01/2022 15.4 11.6 - 15.4 % Final  10/22/2013 14.5 11.5 - 14.5 % Final

## 2024-04-10 ENCOUNTER — Other Ambulatory Visit: Payer: Self-pay | Admitting: Internal Medicine

## 2024-04-10 DIAGNOSIS — E118 Type 2 diabetes mellitus with unspecified complications: Secondary | ICD-10-CM

## 2024-04-13 NOTE — Telephone Encounter (Signed)
 Unable to refill per protocol, courtesy refill already given, OV needed.  Requested Prescriptions  Pending Prescriptions Disp Refills   FARXIGA  10 MG TABS tablet [Pharmacy Med Name: Farxiga  10 MG Oral Tablet] 30 tablet 0    Sig: TAKE 1 TABLET BY MOUTH ONCE DAILY BEFORE BREAKFAST     Endocrinology:  Diabetes - SGLT2 Inhibitors Failed - 04/13/2024  8:43 AM      Failed - Cr in normal range and within 360 days    Creatinine  Date Value Ref Range Status  10/22/2013 1.45 (H) 0.60 - 1.30 mg/dL Final   Creatinine, Ser  Date Value Ref Range Status  10/01/2022 1.20 0.76 - 1.27 mg/dL Final         Failed - HBA1C is between 0 and 7.9 and within 180 days    Hemoglobin A1C  Date Value Ref Range Status  03/19/2023 9.7 (A) 4.0 - 5.6 % Final  10/22/2013 14.0 (H) 4.2 - 6.3 % Final    Comment:    The American Diabetes Association recommends that a primary goal of therapy should be <7% and that physicians should reevaluate the treatment regimen in patients with HbA1c values consistently >8%.    Hgb A1c MFr Bld  Date Value Ref Range Status  03/19/2023 10.8 (H) 4.8 - 5.6 % Final    Comment:             Prediabetes: 5.7 - 6.4          Diabetes: >6.4          Glycemic control for adults with diabetes: <7.0          Failed - eGFR in normal range and within 360 days    EGFR (African American)  Date Value Ref Range Status  10/22/2013 >60  Final   GFR calc Af Amer  Date Value Ref Range Status  07/25/2020 104 >59 mL/min/1.73 Final    Comment:    **[facility] currently reports eGFR in compliance with the current**   recommendations of the [employer]. [facility] will   update reporting as new guidelines are published from the NKF-ASN   Task force.    EGFR (Non-African Amer.)  Date Value Ref Range Status  10/22/2013 >60  Final    Comment:    eGFR values <51mL/min/1.73 m2 may be an indication of chronic kidney disease (CKD). Calculated eGFR is useful in patients with stable renal  function. The eGFR calculation will not be reliable in acutely ill patients when serum creatinine is changing rapidly. It is not useful in  patients on dialysis. The eGFR calculation may not be applicable to patients at the low and high extremes of body sizes, pregnant women, and vegetarians. GLUCOSE - RESULTS VERIFIED BY REPEAT TESTING.  - NOTIFIED OF CRITICAL VALUE  - CALLED [name] 1315 10-22-13  - GAS  - READ-BACK PROCESS PERFORMED.    GFR calc non Af Amer  Date Value Ref Range Status  07/25/2020 90 >59 mL/min/1.73 Final   eGFR  Date Value Ref Range Status  10/01/2022 82 >59 mL/min/1.73 Final         Failed - Valid encounter within last 6 months    Recent Outpatient Visits   None

## 2024-05-08 ENCOUNTER — Ambulatory Visit: Payer: Self-pay | Admitting: Internal Medicine

## 2024-05-08 VITALS — BP 118/78 | HR 98 | Ht 65.0 in | Wt 370.0 lb

## 2024-05-08 DIAGNOSIS — I10 Essential (primary) hypertension: Secondary | ICD-10-CM | POA: Diagnosis not present

## 2024-05-08 DIAGNOSIS — E118 Type 2 diabetes mellitus with unspecified complications: Secondary | ICD-10-CM

## 2024-05-08 DIAGNOSIS — Z7984 Long term (current) use of oral hypoglycemic drugs: Secondary | ICD-10-CM

## 2024-05-08 MED ORDER — TIRZEPATIDE 2.5 MG/0.5ML ~~LOC~~ SOAJ: 2.5000 mg | SUBCUTANEOUS | 0 refills | Status: DC

## 2024-05-08 MED ORDER — JANUMET 50-1000 MG PO TABS: 1.0000 | ORAL_TABLET | Freq: Two times a day (BID) | ORAL | 3 refills | Status: DC

## 2024-05-08 MED ORDER — FARXIGA 10 MG PO TABS: 10.0000 mg | ORAL_TABLET | Freq: Every day | ORAL | 3 refills | Status: AC

## 2024-05-08 NOTE — Assessment & Plan Note (Signed)
 Blood pressure is well controlled on lisinopril  and hctz No medication side effects noted. Plan to continue current medications.

## 2024-05-08 NOTE — Progress Notes (Signed)
 Date:  05/08/2024   Name:  Elijah Wells   DOB:  June 04, 1991   MRN:  [id number]   Chief Complaint: Diabetes  Diabetes He presents for his follow-up diabetic visit. He has type 2 diabetes mellitus. His disease course has been fluctuating. Pertinent negatives for hypoglycemia include no dizziness or headaches. Pertinent negatives for diabetes include no chest pain, no fatigue and no weakness. Current diabetic treatments: Janumet  and Farxiga .  Hypertension This is a chronic problem. The problem is controlled. Pertinent negatives include no chest pain, headaches, palpitations or shortness of breath. Past treatments include ACE inhibitors and diuretics. The current treatment provides significant improvement.    Review of Systems  Constitutional:  Negative for fatigue and unexpected weight change.  HENT:  Negative for nosebleeds.   Eyes:  Negative for visual disturbance.  Respiratory:  Negative for cough, chest tightness, shortness of breath and wheezing.   Cardiovascular:  Negative for chest pain, palpitations and leg swelling.  Gastrointestinal:  Negative for abdominal pain, constipation and diarrhea.  Neurological:  Negative for dizziness, weakness, light-headedness and headaches.     Lab Results  Component Value Date   NA 140 10/01/2022   K 3.8 10/01/2022   CO2 24 10/01/2022   GLUCOSE 166 (H) 10/01/2022   BUN 9 10/01/2022   CREATININE 1.20 10/01/2022   CALCIUM  9.9 10/01/2022   EGFR 82 10/01/2022   GFRNONAA 90 07/25/2020   Lab Results  Component Value Date   CHOL 130 07/25/2021   HDL 35 (L) 07/25/2021   LDLCALC 79 07/25/2021   TRIG 81 07/25/2021   CHOLHDL 3.7 07/25/2021   Lab Results  Component Value Date   TSH 1.700 07/25/2020   Lab Results  Component Value Date   HGBA1C 10.8 (H) 03/19/2023   Lab Results  Component Value Date   WBC 6.4 10/01/2022   HGB 15.1 10/01/2022   HCT 47.2 10/01/2022   MCV 81 10/01/2022   PLT 282 10/01/2022   Lab Results  Component  Value Date   ALT 101 (H) 10/01/2022   AST 112 (H) 10/01/2022   ALKPHOS 81 10/01/2022   BILITOT 0.6 10/01/2022   Lab Results  Component Value Date   VD25OH 23.6 (L) 04/14/2020     Patient Active Problem List   Diagnosis Date Noted   Abnormal liver enzymes 01/29/2022   Vitamin D  deficiency 06/23/2020   Class 3 severe obesity with serious comorbidity and body mass index (BMI) of 50.0 to 59.9 in adult 06/23/2020   Type II diabetes mellitus with complication (HCC) 09/20/2017   Essential hypertension 09/20/2017   Gastroesophageal reflux disease 09/20/2017    No Known Allergies  Past Surgical History:  Procedure Laterality Date   none     WISDOM TOOTH EXTRACTION  01/2019    Social History   Tobacco Use   Smoking status: Never   Smokeless tobacco: Never  Vaping Use   Vaping status: Never Used  Substance Use Topics   Alcohol use: No   Drug use: No     Medication list has been reviewed and updated.  Current Meds  Medication Sig   Cholecalciferol (VITAMIN D3) 1.25 MG (50000 UT) CAPS Take 1 capsule by mouth once a week   Continuous Glucose Sensor (DEXCOM G7 SENSOR) MISC 1 each by Does not apply route continuous.   hydrochlorothiazide  (HYDRODIURIL ) 25 MG tablet TAKE 1 TABLET DAILY   Insulin Pen Needle (PEN NEEDLES) 32G X 5 MM MISC 1 each by Does not apply route once  a week.   lisinopril  (ZESTRIL ) 20 MG tablet TAKE 1 TABLET DAILY   mupirocin  cream (BACTROBAN ) 2 % Apply 1 Application topically 2 (two) times daily.   omeprazole (PRILOSEC) 20 MG capsule Take 20 mg by mouth daily as needed.    tirzepatide (MOUNJARO) 2.5 MG/0.5ML Pen Inject 2.5 mg into the skin once a week.   [DISCONTINUED] FARXIGA  10 MG TABS tablet TAKE 1 TABLET BY MOUTH ONCE DAILY BEFORE BREAKFAST   [DISCONTINUED] sitaGLIPtin -metformin  (JANUMET ) 50-1000 MG tablet TAKE 1 TABLET BY MOUTH TWICE DAILY WITH A MEAL       05/08/2024    2:13 PM 07/12/2023    3:42 PM 03/19/2023    2:47 PM 12/17/2022    3:10 PM  GAD 7  : Generalized Anxiety Score  Nervous, Anxious, on Edge 0 1 1 0  Control/stop worrying 0 0 0 0  Worry too much - different things 0 1 1 1   Trouble relaxing 0 0 0 0  Restless 0 0 0 0  Easily annoyed or irritable 0 0 0 0  Afraid - awful might happen 0 1 0 0  Total GAD 7 Score 0 3 2 1   Anxiety Difficulty Not difficult at all Not difficult at all Not difficult at all Not difficult at all       05/08/2024    2:13 PM 07/12/2023    3:42 PM 03/19/2023    2:47 PM  Depression screen PHQ 2/9  Decreased Interest 0 1 0  Down, Depressed, Hopeless 0 0 0  PHQ - 2 Score 0 1 0  Altered sleeping 0 1 0  Tired, decreased energy 0 0 1  Change in appetite 0 1 1  Feeling bad or failure about yourself  0 1 0  Trouble concentrating 0 0 1  Moving slowly or fidgety/restless 0 1 0  Suicidal thoughts 0 0 0  PHQ-9 Score 0 5 3  Difficult doing work/chores Not difficult at all Not difficult at all Not difficult at all    BP Readings from Last 3 Encounters:  05/08/24 118/78  07/12/23 125/86  03/28/23 (!) 140/94    Physical Exam Vitals and nursing note reviewed.  Constitutional:      General: He is not in acute distress.    Appearance: He is well-developed. He is obese.  HENT:     Head: Normocephalic and atraumatic.  Cardiovascular:     Rate and Rhythm: Normal rate and regular rhythm.  Pulmonary:     Effort: Pulmonary effort is normal. No respiratory distress.     Breath sounds: No wheezing or rhonchi.  Musculoskeletal:     Cervical back: Normal range of motion.     Right lower leg: No edema.     Left lower leg: No edema.  Lymphadenopathy:     Cervical: No cervical adenopathy.  Skin:    General: Skin is warm and dry.     Findings: No rash.  Neurological:     Mental Status: He is alert and oriented to person, place, and time.  Psychiatric:        Mood and Affect: Mood normal.        Behavior: Behavior normal.     Wt Readings from Last 3 Encounters:  05/08/24 (!) 370 lb (167.8 kg)   07/12/23 (!) 381 lb 12.8 oz (173.2 kg)  03/28/23 (!) 387 lb (175.5 kg)    BP 118/78   Pulse 98   Ht 5' 5 (1.651 m)   Wt (!) 370 lb (  167.8 kg)   SpO2 97%   BMI 61.57 kg/m   Assessment and Plan:  Problem List Items Addressed This Visit       Unprioritized   Essential hypertension (Chronic)   Blood pressure is well controlled on lisinopril  and hctz No medication side effects noted. Plan to continue current medications.       Type II diabetes mellitus with complication (HCC) - Primary   BS uncontrolled due to being out of Ozempic  for 2 months.  He was referred to Endo but that did not happen and he ran out of medications.  He was not controlled anyway on Ozempic . Still taking Janumet  and Farxiga . Blood sugars are running 175 at home. Will continue Janumet  and Farxiga ; start Mounjaro and titrate up monthly. Follow up in 3 months.      Relevant Medications   tirzepatide (MOUNJARO) 2.5 MG/0.5ML Pen   FARXIGA  10 MG TABS tablet   sitaGLIPtin -metformin  (JANUMET ) 50-1000 MG tablet   Other Relevant Orders   Comprehensive metabolic panel with GFR   Hemoglobin A1c   Microalbumin / creatinine urine ratio   Lipid panel   Other Visit Diagnoses       Long term current use of oral hypoglycemic drug           Return in about 3 months (around 08/08/2024) for DM.    Leita HILARIO Adie, MD [facility] [place]

## 2024-05-08 NOTE — Assessment & Plan Note (Addendum)
 BS uncontrolled due to being out of Ozempic  for 2 months.  He was referred to Endo but that did not happen and he ran out of medications.  He was not controlled anyway on Ozempic . Still taking Janumet  and Farxiga . Blood sugars are running 175 at home. Will continue Janumet  and Farxiga ; start Mounjaro and titrate up monthly. Follow up in 3 months.

## 2024-05-09 LAB — LIPID PANEL
Chol/HDL Ratio: 4.1 ratio (ref 0.0–5.0)
Cholesterol, Total: 160 mg/dL (ref 100–199)
HDL: 39 mg/dL — ABNORMAL LOW (ref >39)
LDL Chol Calc (NIH): 93 mg/dL (ref 0–99)
Triglycerides: 159 mg/dL — ABNORMAL HIGH (ref 0–149)
VLDL Cholesterol Cal: 28 mg/dL (ref 5–40)

## 2024-05-09 LAB — COMPREHENSIVE METABOLIC PANEL WITH GFR
ALT: 92 IU/L — ABNORMAL HIGH (ref 0–44)
AST: 75 IU/L — ABNORMAL HIGH (ref 0–40)
Albumin: 4.4 g/dL (ref 4.1–5.1)
Alkaline Phosphatase: 91 IU/L (ref 44–121)
BUN/Creatinine Ratio: 11 (ref 9–20)
BUN: 12 mg/dL (ref 6–20)
Bilirubin Total: 0.5 mg/dL (ref 0.0–1.2)
CO2: 24 mmol/L (ref 20–29)
Calcium: 10.5 mg/dL — ABNORMAL HIGH (ref 8.7–10.2)
Chloride: 98 mmol/L (ref 96–106)
Creatinine, Ser: 1.09 mg/dL (ref 0.76–1.27)
Globulin, Total: 3.8 g/dL (ref 1.5–4.5)
Glucose: 142 mg/dL — ABNORMAL HIGH (ref 70–99)
Potassium: 4.1 mmol/L (ref 3.5–5.2)
Sodium: 140 mmol/L (ref 134–144)
Total Protein: 8.2 g/dL (ref 6.0–8.5)
eGFR: 92 mL/min/1.73 (ref >59)

## 2024-05-09 LAB — HEMOGLOBIN A1C
Est. average glucose Bld gHb Est-mCnc: 286 mg/dL
Hgb A1c MFr Bld: 11.6 % — ABNORMAL HIGH (ref 4.8–5.6)

## 2024-05-09 LAB — MICROALBUMIN / CREATININE URINE RATIO
Creatinine, Urine: 69.2 mg/dL
Microalb/Creat Ratio: 280 mg/g{creat} — ABNORMAL HIGH (ref 0–29)
Microalbumin, Urine: 193.8 ug/mL

## 2024-05-10 ENCOUNTER — Ambulatory Visit: Payer: Self-pay | Admitting: Internal Medicine

## 2024-05-12 ENCOUNTER — Other Ambulatory Visit (HOSPITAL_COMMUNITY): Payer: Self-pay

## 2024-05-12 ENCOUNTER — Telehealth: Payer: Self-pay | Admitting: Pharmacy Technician

## 2024-05-12 ENCOUNTER — Encounter: Payer: Self-pay | Admitting: Internal Medicine

## 2024-05-12 NOTE — Telephone Encounter (Signed)
 Pharmacy Patient Advocate Encounter   Received notification from Patient Advice Request messages that prior authorization for Mounjaro  2.5MG /0.5ML auto-injectors is required/requested.   Insurance verification completed.   The patient is insured through [facility] .   Per test claim: PA required; PA submitted to above mentioned insurance via CoverMyMeds Key/confirmation #/EOC [id number] Status is pending

## 2024-05-12 NOTE — Telephone Encounter (Signed)
 PA request has been Submitted. New Encounter has been or will be created for follow up. For additional info see Pharmacy Prior Auth telephone encounter from 05/12/24.

## 2024-05-13 ENCOUNTER — Other Ambulatory Visit (HOSPITAL_COMMUNITY): Payer: Self-pay

## 2024-05-13 NOTE — Telephone Encounter (Signed)
 Patient informed of approval with my chart message.

## 2024-05-13 NOTE — Telephone Encounter (Signed)
 Pharmacy Patient Advocate Encounter  Received notification from [facility] that Prior Authorization for Mounjaro  2.5MG /0.5ML auto-injectors has been APPROVED from 05/12/24 to 05/13/27. Unable to obtain price due to refill too soon rejection, last fill date 05/12/24 next available fill date08/23/25   PA #/Case ID/Reference #: [id number]

## 2024-05-26 ENCOUNTER — Other Ambulatory Visit: Payer: Self-pay | Admitting: Internal Medicine

## 2024-05-26 ENCOUNTER — Other Ambulatory Visit: Payer: Self-pay

## 2024-05-26 ENCOUNTER — Encounter: Payer: Self-pay | Admitting: Internal Medicine

## 2024-05-26 DIAGNOSIS — I10 Essential (primary) hypertension: Secondary | ICD-10-CM

## 2024-05-26 MED ORDER — HYDROCHLOROTHIAZIDE 25 MG PO TABS: 25.0000 mg | ORAL_TABLET | Freq: Every day | ORAL | 0 refills | Status: DC

## 2024-05-26 MED ORDER — LISINOPRIL 20 MG PO TABS: 20.0000 mg | ORAL_TABLET | Freq: Every day | ORAL | 0 refills | Status: DC

## 2024-05-26 MED ORDER — HYDROCHLOROTHIAZIDE 25 MG PO TABS: 25.0000 mg | ORAL_TABLET | Freq: Every day | ORAL | 1 refills | Status: AC

## 2024-05-26 MED ORDER — LISINOPRIL 20 MG PO TABS: 20.0000 mg | ORAL_TABLET | Freq: Every day | ORAL | 1 refills | Status: AC

## 2024-05-26 NOTE — Telephone Encounter (Signed)
 Copied from CRM [id number]. Topic: Clinical - Medication Refill >> May 26, 2024  9:11 AM Elijah Wells wrote:  Patient is running low and will be traveling and would like medication sent to [facility] and expedited.  Medication: lisinopril  (ZESTRIL ) 20 MG tablet, hydrochlorothiazide  (HYDRODIURIL ) 25 MG tablet  Has the patient contacted their pharmacy? Yes  (Agent: If yes, when and what did the pharmacy advise?) Contact PCP   This is the patient's preferred pharmacy:  [facility] [address], [place] - [address] [place] [address] Phone: [phone] Fax: [phone]    Is this the correct pharmacy for this prescription? Yes If no, delete pharmacy and type the correct one.   Has the prescription been filled recently? Yes  Is the patient out of the medication? No  Has the patient been seen for an appointment in the last year OR does the patient have an upcoming appointment? Yes  Can we respond through MyChart? No  Agent: Please be advised that Rx refills may take up to 3 business days. We ask that you follow-up with your pharmacy.

## 2024-05-28 MED ORDER — LISINOPRIL 20 MG PO TABS: 20.0000 mg | ORAL_TABLET | Freq: Every day | ORAL | 1 refills | Status: AC

## 2024-05-28 MED ORDER — HYDROCHLOROTHIAZIDE 25 MG PO TABS: 25.0000 mg | ORAL_TABLET | Freq: Every day | ORAL | 1 refills | Status: AC

## 2024-05-28 NOTE — Telephone Encounter (Signed)
 Requested Prescriptions  Pending Prescriptions Disp Refills   lisinopril  (ZESTRIL ) 20 MG tablet 90 tablet 1    Sig: Take 1 tablet (20 mg total) by mouth daily.     Cardiovascular:  ACE Inhibitors Passed - 05/28/2024  4:15 PM      Passed - Cr in normal range and within 180 days    Creatinine  Date Value Ref Range Status  10/22/2013 1.45 (H) 0.60 - 1.30 mg/dL Final   Creatinine, Ser  Date Value Ref Range Status  05/08/2024 1.09 0.76 - 1.27 mg/dL Final         Passed - K in normal range and within 180 days    Potassium  Date Value Ref Range Status  05/08/2024 4.1 3.5 - 5.2 mmol/L Final  10/22/2013 4.2 3.5 - 5.1 mmol/L Final         Passed - Patient is not pregnant      Passed - Last BP in normal range    BP Readings from Last 1 Encounters:  05/08/24 118/78         Passed - Valid encounter within last 6 months    Recent Outpatient Visits           2 weeks ago Type II diabetes mellitus with complication [facility])   [place] [facility] at [facility], Leita DEL, MD               hydrochlorothiazide  (HYDRODIURIL ) 25 MG tablet 90 tablet 1    Sig: Take 1 tablet (25 mg total) by mouth daily.     Cardiovascular: Diuretics - Thiazide Passed - 05/28/2024  4:15 PM      Passed - Cr in normal range and within 180 days    Creatinine  Date Value Ref Range Status  10/22/2013 1.45 (H) 0.60 - 1.30 mg/dL Final   Creatinine, Ser  Date Value Ref Range Status  05/08/2024 1.09 0.76 - 1.27 mg/dL Final         Passed - K in normal range and within 180 days    Potassium  Date Value Ref Range Status  05/08/2024 4.1 3.5 - 5.2 mmol/L Final  10/22/2013 4.2 3.5 - 5.1 mmol/L Final         Passed - Na in normal range and within 180 days    Sodium  Date Value Ref Range Status  05/08/2024 140 134 - 144 mmol/L Final  10/22/2013 129 (L) 136 - 145 mmol/L Final         Passed - Last BP in normal range    BP Readings from Last 1 Encounters:  05/08/24  118/78         Passed - Valid encounter within last 6 months    Recent Outpatient Visits           2 weeks ago Type II diabetes mellitus with complication [facility])   [place] [facility] at [facility], Leita DEL, MD

## 2024-05-29 ENCOUNTER — Encounter: Payer: Self-pay | Admitting: Internal Medicine

## 2024-05-29 ENCOUNTER — Other Ambulatory Visit: Payer: Self-pay

## 2024-05-29 DIAGNOSIS — E118 Type 2 diabetes mellitus with unspecified complications: Secondary | ICD-10-CM

## 2024-05-29 MED ORDER — TIRZEPATIDE 5 MG/0.5ML ~~LOC~~ SOAJ
5.0000 mg | SUBCUTANEOUS | 0 refills | Status: DC
Start: 2024-05-29 — End: 2024-06-29

## 2024-06-29 ENCOUNTER — Encounter: Payer: Self-pay | Admitting: Internal Medicine

## 2024-06-29 ENCOUNTER — Other Ambulatory Visit: Payer: Self-pay | Admitting: Internal Medicine

## 2024-06-29 DIAGNOSIS — E118 Type 2 diabetes mellitus with unspecified complications: Secondary | ICD-10-CM

## 2024-06-29 MED ORDER — TIRZEPATIDE 7.5 MG/0.5ML ~~LOC~~ SOAJ: 7.5000 mg | SUBCUTANEOUS | 0 refills | Status: DC

## 2024-06-29 NOTE — Progress Notes (Unsigned)
 Date:  06/29/2024   Name:  Elijah Wells   DOB:  20-May-1991   MRN:  [id number]   Chief Complaint: No chief complaint on file.  HPI  Review of Systems   Lab Results  Component Value Date   NA 140 05/08/2024   K 4.1 05/08/2024   CO2 24 05/08/2024   GLUCOSE 142 (H) 05/08/2024   BUN 12 05/08/2024   CREATININE 1.09 05/08/2024   CALCIUM  10.5 (H) 05/08/2024   EGFR 92 05/08/2024   GFRNONAA 90 07/25/2020   Lab Results  Component Value Date   CHOL 160 05/08/2024   HDL 39 (L) 05/08/2024   LDLCALC 93 05/08/2024   TRIG 159 (H) 05/08/2024   CHOLHDL 4.1 05/08/2024   Lab Results  Component Value Date   TSH 1.700 07/25/2020   Lab Results  Component Value Date   HGBA1C 11.6 (H) 05/08/2024   Lab Results  Component Value Date   WBC 6.4 10/01/2022   HGB 15.1 10/01/2022   HCT 47.2 10/01/2022   MCV 81 10/01/2022   PLT 282 10/01/2022   Lab Results  Component Value Date   ALT 92 (H) 05/08/2024   AST 75 (H) 05/08/2024   ALKPHOS 91 05/08/2024   BILITOT 0.5 05/08/2024   Lab Results  Component Value Date   VD25OH 23.6 (L) 04/14/2020     Patient Active Problem List   Diagnosis Date Noted   Abnormal liver enzymes 01/29/2022   Vitamin D  deficiency 06/23/2020   Class 3 severe obesity with serious comorbidity and body mass index (BMI) of 50.0 to 59.9 in adult 06/23/2020   Type II diabetes mellitus with complication (HCC) 09/20/2017   Essential hypertension 09/20/2017   Gastroesophageal reflux disease 09/20/2017    No Known Allergies  Past Surgical History:  Procedure Laterality Date   none     WISDOM TOOTH EXTRACTION  01/2019    Social History   Tobacco Use   Smoking status: Never   Smokeless tobacco: Never  Vaping Use   Vaping status: Never Used  Substance Use Topics   Alcohol use: No   Drug use: No     Medication list has been reviewed and updated.  No outpatient medications have been marked as taking for the 06/29/24 encounter (Orders Only) with  Justus Leita DEL, MD.       05/08/2024    2:13 PM 07/12/2023    3:42 PM 03/19/2023    2:47 PM 12/17/2022    3:10 PM  GAD 7 : Generalized Anxiety Score  Nervous, Anxious, on Edge 0 1 1 0  Control/stop worrying 0 0 0 0  Worry too much - different things 0 1 1 1   Trouble relaxing 0 0 0 0  Restless 0 0 0 0  Easily annoyed or irritable 0 0 0 0  Afraid - awful might happen 0 1 0 0  Total GAD 7 Score 0 3 2 1   Anxiety Difficulty Not difficult at all Not difficult at all Not difficult at all Not difficult at all       05/08/2024    2:13 PM 07/12/2023    3:42 PM 03/19/2023    2:47 PM  Depression screen PHQ 2/9  Decreased Interest 0 1 0  Down, Depressed, Hopeless 0 0 0  PHQ - 2 Score 0 1 0  Altered sleeping 0 1 0  Tired, decreased energy 0 0 1  Change in appetite 0 1 1  Feeling bad or failure about yourself  0 1 0  Trouble concentrating 0 0 1  Moving slowly or fidgety/restless 0 1 0  Suicidal thoughts 0 0 0  PHQ-9 Score 0 5 3  Difficult doing work/chores Not difficult at all Not difficult at all Not difficult at all    BP Readings from Last 3 Encounters:  05/08/24 118/78  07/12/23 125/86  03/28/23 (!) 140/94    Physical Exam  Wt Readings from Last 3 Encounters:  05/08/24 (!) 370 lb (167.8 kg)  07/12/23 (!) 381 lb 12.8 oz (173.2 kg)  03/28/23 (!) 387 lb (175.5 kg)    There were no vitals taken for this visit.  Assessment and Plan:  Problem List Items Addressed This Visit   None   No follow-ups on file.    Leita HILARIO Adie, MD [facility] [place]

## 2024-06-29 NOTE — Telephone Encounter (Signed)
 Please review.  KP

## 2024-06-29 NOTE — Telephone Encounter (Signed)
 Patient response.  Elijah Wells

## 2024-07-10 ENCOUNTER — Other Ambulatory Visit: Payer: Self-pay | Admitting: Internal Medicine

## 2024-07-14 NOTE — Telephone Encounter (Signed)
 Discontinued on 06/29/24.  Requested Prescriptions  Pending Prescriptions Disp Refills   MOUNJARO  5 MG/0.5ML Pen [Pharmacy Med Name: Mounjaro  5 MG/0.5ML Subcutaneous Solution Pen-injector] 4 mL 0    Sig: INJECT 5MG  SUBCUTANEOUSLY  ONCE A WEEK     Off-Protocol Failed - 07/14/2024  8:37 AM      Failed - Medication not assigned to a protocol, review manually.      Passed - Valid encounter within last 12 months    Recent Outpatient Visits           2 months ago Type II diabetes mellitus with complication [facility])    Primary Care & Sports Medicine at [facility], Leita DEL, MD

## 2024-07-28 ENCOUNTER — Other Ambulatory Visit: Payer: Self-pay | Admitting: Internal Medicine

## 2024-07-28 DIAGNOSIS — E118 Type 2 diabetes mellitus with unspecified complications: Secondary | ICD-10-CM

## 2024-07-28 MED ORDER — TIRZEPATIDE 10 MG/0.5ML ~~LOC~~ SOAJ: 10.0000 mg | SUBCUTANEOUS | 0 refills | Status: DC

## 2024-07-28 NOTE — Telephone Encounter (Signed)
 Please review.  KP

## 2024-07-28 NOTE — Progress Notes (Unsigned)
 Date:  07/28/2024   Name:  Elijah Wells   DOB:  1990/12/08   MRN:  [id number]   Chief Complaint: No chief complaint on file.  HPI  Review of Systems   Lab Results  Component Value Date   NA 140 05/08/2024   K 4.1 05/08/2024   CO2 24 05/08/2024   GLUCOSE 142 (H) 05/08/2024   BUN 12 05/08/2024   CREATININE 1.09 05/08/2024   CALCIUM  10.5 (H) 05/08/2024   EGFR 92 05/08/2024   GFRNONAA 90 07/25/2020   Lab Results  Component Value Date   CHOL 160 05/08/2024   HDL 39 (L) 05/08/2024   LDLCALC 93 05/08/2024   TRIG 159 (H) 05/08/2024   CHOLHDL 4.1 05/08/2024   Lab Results  Component Value Date   TSH 1.700 07/25/2020   Lab Results  Component Value Date   HGBA1C 11.6 (H) 05/08/2024   Lab Results  Component Value Date   WBC 6.4 10/01/2022   HGB 15.1 10/01/2022   HCT 47.2 10/01/2022   MCV 81 10/01/2022   PLT 282 10/01/2022   Lab Results  Component Value Date   ALT 92 (H) 05/08/2024   AST 75 (H) 05/08/2024   ALKPHOS 91 05/08/2024   BILITOT 0.5 05/08/2024   Lab Results  Component Value Date   VD25OH 23.6 (L) 04/14/2020     Patient Active Problem List   Diagnosis Date Noted   Abnormal liver enzymes 01/29/2022   Vitamin D  deficiency 06/23/2020   Class 3 severe obesity with serious comorbidity and body mass index (BMI) of 50.0 to 59.9 in adult (HCC) 06/23/2020   Type II diabetes mellitus with complication (HCC) 09/20/2017   Essential hypertension 09/20/2017   Gastroesophageal reflux disease 09/20/2017    No Known Allergies  Past Surgical History:  Procedure Laterality Date   none     WISDOM TOOTH EXTRACTION  01/2019    Social History   Tobacco Use   Smoking status: Never   Smokeless tobacco: Never  Vaping Use   Vaping status: Never Used  Substance Use Topics   Alcohol use: No   Drug use: No     Medication list has been reviewed and updated.  No outpatient medications have been marked as taking for the 07/28/24 encounter (Orders Only)  with Justus Leita DEL, MD.       05/08/2024    2:13 PM 07/12/2023    3:42 PM 03/19/2023    2:47 PM 12/17/2022    3:10 PM  GAD 7 : Generalized Anxiety Score  Nervous, Anxious, on Edge 0 1 1 0  Control/stop worrying 0 0 0 0  Worry too much - different things 0 1 1 1   Trouble relaxing 0 0 0 0  Restless 0 0 0 0  Easily annoyed or irritable 0 0 0 0  Afraid - awful might happen 0 1 0 0  Total GAD 7 Score 0 3 2 1   Anxiety Difficulty Not difficult at all Not difficult at all Not difficult at all Not difficult at all       05/08/2024    2:13 PM 07/12/2023    3:42 PM 03/19/2023    2:47 PM  Depression screen PHQ 2/9  Decreased Interest 0 1 0  Down, Depressed, Hopeless 0 0 0  PHQ - 2 Score 0 1 0  Altered sleeping 0 1 0  Tired, decreased energy 0 0 1  Change in appetite 0 1 1  Feeling bad or failure about yourself  0 1 0  Trouble concentrating 0 0 1  Moving slowly or fidgety/restless 0 1 0  Suicidal thoughts 0 0 0  PHQ-9 Score 0 5 3  Difficult doing work/chores Not difficult at all Not difficult at all Not difficult at all    BP Readings from Last 3 Encounters:  05/08/24 118/78  07/12/23 125/86  03/28/23 (!) 140/94    Physical Exam  Wt Readings from Last 3 Encounters:  05/08/24 (!) 370 lb (167.8 kg)  07/12/23 (!) 381 lb 12.8 oz (173.2 kg)  03/28/23 (!) 387 lb (175.5 kg)    There were no vitals taken for this visit.  Assessment and Plan:  Problem List Items Addressed This Visit   None   No follow-ups on file.    Leita HILARIO Adie, MD [facility] Primary [facility] [place]

## 2024-08-14 ENCOUNTER — Ambulatory Visit: Admitting: Internal Medicine

## 2024-08-21 ENCOUNTER — Ambulatory Visit: Admitting: Internal Medicine

## 2024-08-21 ENCOUNTER — Encounter: Payer: Self-pay | Admitting: Internal Medicine

## 2024-08-21 VITALS — BP 126/76 | HR 98 | Ht 65.0 in | Wt 374.0 lb

## 2024-08-21 DIAGNOSIS — Z7985 Long-term (current) use of injectable non-insulin antidiabetic drugs: Secondary | ICD-10-CM

## 2024-08-21 DIAGNOSIS — Z23 Encounter for immunization: Secondary | ICD-10-CM | POA: Diagnosis not present

## 2024-08-21 DIAGNOSIS — E118 Type 2 diabetes mellitus with unspecified complications: Secondary | ICD-10-CM | POA: Diagnosis not present

## 2024-08-21 NOTE — Assessment & Plan Note (Addendum)
 Currently medications are Janumet , Farxiga  and Mounjaro .  No hypoglycemic episodes noted. Home blood sugars in the 150 range.  He has mild erythema at the injection sites lasting a day or two.  He is only alternating right and left sites. Last visit medical regimen changes were to add Mounjaro  - now up to 10 mg weekly.  No GI side effects.  Weight loss has plateaued. Lab Results  Component Value Date   HGBA1C 11.6 (H) 05/08/2024  Will get labs today to check A1C and liver functions If A c is significantly improved, will change Janumet  to metformin  alone. Increase Mounjaro  dose next refill.  Rotate to more than 2 sites for injection - can also use the thigh. Topical cortisone if needed. Reminded to have eye exam done.

## 2024-08-21 NOTE — Progress Notes (Signed)
 Date:  08/21/2024   Name:  Elijah Wells   DOB:  1991-09-11   MRN:  [id number]   Chief Complaint: Diabetes  Diabetes He presents for his follow-up diabetic visit. He has type 2 diabetes mellitus. His disease course has been improving. Pertinent negatives for hypoglycemia include no dizziness, headaches or nervousness/anxiousness. Pertinent negatives for diabetes include no chest pain, no fatigue and no weakness.    Review of Systems  Constitutional:  Negative for fatigue and unexpected weight change (weight loss has stalled).  Eyes:  Negative for visual disturbance.  Respiratory:  Negative for cough, chest tightness, shortness of breath and wheezing.   Cardiovascular:  Negative for chest pain, palpitations and leg swelling.  Gastrointestinal:  Negative for abdominal pain, constipation and diarrhea.  Skin:  Positive for color change.  Neurological:  Negative for dizziness, weakness, light-headedness and headaches.  Psychiatric/Behavioral:  Negative for dysphoric mood and sleep disturbance. The patient is not nervous/anxious.      Lab Results  Component Value Date   NA 140 05/08/2024   K 4.1 05/08/2024   CO2 24 05/08/2024   GLUCOSE 142 (H) 05/08/2024   BUN 12 05/08/2024   CREATININE 1.09 05/08/2024   CALCIUM  10.5 (H) 05/08/2024   EGFR 92 05/08/2024   GFRNONAA 90 07/25/2020   Lab Results  Component Value Date   CHOL 160 05/08/2024   HDL 39 (L) 05/08/2024   LDLCALC 93 05/08/2024   TRIG 159 (H) 05/08/2024   CHOLHDL 4.1 05/08/2024   Lab Results  Component Value Date   TSH 1.700 07/25/2020   Lab Results  Component Value Date   HGBA1C 11.6 (H) 05/08/2024   Lab Results  Component Value Date   WBC 6.4 10/01/2022   HGB 15.1 10/01/2022   HCT 47.2 10/01/2022   MCV 81 10/01/2022   PLT 282 10/01/2022   Lab Results  Component Value Date   ALT 92 (H) 05/08/2024   AST 75 (H) 05/08/2024   ALKPHOS 91 05/08/2024   BILITOT 0.5 05/08/2024   Lab Results  Component Value  Date   VD25OH 23.6 (L) 04/14/2020     Patient Active Problem List   Diagnosis Date Noted   Abnormal liver enzymes 01/29/2022   Vitamin D  deficiency 06/23/2020   Class 3 severe obesity with serious comorbidity and body mass index (BMI) of 50.0 to 59.9 in adult (HCC) 06/23/2020   Type II diabetes mellitus with complication (HCC) 09/20/2017   Essential hypertension 09/20/2017   Gastroesophageal reflux disease 09/20/2017    No Known Allergies  Past Surgical History:  Procedure Laterality Date   none     WISDOM TOOTH EXTRACTION  01/2019    Social History   Tobacco Use   Smoking status: Never   Smokeless tobacco: Never  Vaping Use   Vaping status: Never Used  Substance Use Topics   Alcohol use: No   Drug use: No     Medication list has been reviewed and updated.  Current Meds  Medication Sig   Cholecalciferol (VITAMIN D3) 1.25 MG (50000 UT) CAPS Take 1 capsule by mouth once a week   Continuous Glucose Sensor (DEXCOM G7 SENSOR) MISC 1 each by Does not apply route continuous.   FARXIGA  10 MG TABS tablet Take 1 tablet (10 mg total) by mouth daily before breakfast.   hydrochlorothiazide  (HYDRODIURIL ) 25 MG tablet Take 1 tablet (25 mg total) by mouth daily.   hydrochlorothiazide  (HYDRODIURIL ) 25 MG tablet Take 1 tablet (25 mg total) by mouth  daily.   Insulin Pen Needle (PEN NEEDLES) 32G X 5 MM MISC 1 each by Does not apply route once a week.   lisinopril  (ZESTRIL ) 20 MG tablet Take 1 tablet (20 mg total) by mouth daily.   lisinopril  (ZESTRIL ) 20 MG tablet Take 1 tablet (20 mg total) by mouth daily.   mupirocin  cream (BACTROBAN ) 2 % Apply 1 Application topically 2 (two) times daily.   omeprazole (PRILOSEC) 20 MG capsule Take 20 mg by mouth daily as needed.    sitaGLIPtin -metformin  (JANUMET ) 50-1000 MG tablet Take 1 tablet by mouth 2 (two) times daily with a meal.   tirzepatide  (MOUNJARO ) 10 MG/0.5ML Pen Inject 10 mg into the skin once a week.       05/08/2024    2:13 PM  07/12/2023    3:42 PM 03/19/2023    2:47 PM 12/17/2022    3:10 PM  GAD 7 : Generalized Anxiety Score  Nervous, Anxious, on Edge 0 1 1 0  Control/stop worrying 0 0 0 0  Worry too much - different things 0 1 1 1   Trouble relaxing 0 0 0 0  Restless 0 0 0 0  Easily annoyed or irritable 0 0 0 0  Afraid - awful might happen 0 1 0 0  Total GAD 7 Score 0 3 2 1   Anxiety Difficulty Not difficult at all Not difficult at all Not difficult at all Not difficult at all       05/08/2024    2:13 PM 07/12/2023    3:42 PM 03/19/2023    2:47 PM  Depression screen PHQ 2/9  Decreased Interest 0 1 0  Down, Depressed, Hopeless 0 0 0  PHQ - 2 Score 0 1 0  Altered sleeping 0 1 0  Tired, decreased energy 0 0 1  Change in appetite 0 1 1  Feeling bad or failure about yourself  0 1 0  Trouble concentrating 0 0 1  Moving slowly or fidgety/restless 0 1 0  Suicidal thoughts 0 0 0  PHQ-9 Score 0  5  3   Difficult doing work/chores Not difficult at all Not difficult at all Not difficult at all     Data saved with a previous flowsheet row definition    BP Readings from Last 3 Encounters:  08/21/24 126/76  05/08/24 118/78  07/12/23 125/86    Physical Exam Vitals and nursing note reviewed.  Constitutional:      General: He is not in acute distress.    Appearance: He is well-developed. He is obese.  HENT:     Head: Normocephalic and atraumatic.  Cardiovascular:     Rate and Rhythm: Normal rate and regular rhythm.  Pulmonary:     Effort: Pulmonary effort is normal. No respiratory distress.     Breath sounds: No wheezing or rhonchi.  Musculoskeletal:     Cervical back: Normal range of motion.     Right lower leg: No edema.     Left lower leg: No edema.  Lymphadenopathy:     Cervical: No cervical adenopathy.  Skin:    General: Skin is warm and dry.     Findings: No rash.         Comments: Mild erythema at last injection site - non tender, not indurated  Neurological:     General: No focal deficit  present.     Mental Status: He is alert and oriented to person, place, and time.  Psychiatric:        Mood  and Affect: Mood normal.        Behavior: Behavior normal.    Diabetic Foot Exam - Simple   Simple Foot Form Diabetic Foot exam was performed with the following findings: Yes 08/21/2024  4:23 PM  Visual Inspection See comments: Yes Sensation Testing Intact to touch and monofilament testing bilaterally: Yes Pulse Check Posterior Tibialis and Dorsalis pulse intact bilaterally: Yes Comments Flat feet bilaterally      Wt Readings from Last 3 Encounters:  08/21/24 (!) 374 lb (169.6 kg)  05/08/24 (!) 370 lb (167.8 kg)  07/12/23 (!) 381 lb 12.8 oz (173.2 kg)    BP 126/76   Pulse 98   Ht 5' 5 (1.651 m)   Wt (!) 374 lb (169.6 kg)   SpO2 96%   BMI 62.24 kg/m   Assessment and Plan:  Problem List Items Addressed This Visit       Unprioritized   Type II diabetes mellitus with complication (HCC) - Primary (Chronic)   Currently medications are Janumet , Farxiga  and Mounjaro .  No hypoglycemic episodes noted. Home blood sugars in the 150 range.  He has mild erythema at the injection sites lasting a day or two.  He is only alternating right and left sites. Last visit medical regimen changes were to add Mounjaro  - now up to 10 mg weekly.  No GI side effects.  Weight loss has plateaued. Lab Results  Component Value Date   HGBA1C 11.6 (H) 05/08/2024  Will get labs today to check A1C and liver functions If A c is significantly improved, will change Janumet  to metformin  alone. Increase Mounjaro  dose next refill.  Rotate to more than 2 sites for injection - can also use the thigh. Topical cortisone if needed. Reminded to have eye exam done.       Relevant Orders   Comprehensive metabolic panel with GFR   Hemoglobin A1c   Other Visit Diagnoses       Long-term current use of injectable noninsulin antidiabetic medication         Encounter for immunization       Relevant  Orders   Flu vaccine trivalent PF, 6mos and older(Flulaval,Afluria,Fluarix,Fluzone) (Completed)       Return in about 4 months (around 12/19/2024) for CPX  Dr. Sol.    Leita HILARIO Adie, MD [facility] [place]

## 2024-08-22 LAB — COMPREHENSIVE METABOLIC PANEL WITH GFR
ALT: 60 IU/L — ABNORMAL HIGH (ref 0–44)
AST: 47 IU/L — ABNORMAL HIGH (ref 0–40)
Albumin: 4.3 g/dL (ref 4.1–5.1)
Alkaline Phosphatase: 80 IU/L (ref 47–123)
BUN/Creatinine Ratio: 13 (ref 9–20)
BUN: 14 mg/dL (ref 6–20)
Bilirubin Total: 0.7 mg/dL (ref 0.0–1.2)
CO2: 26 mmol/L (ref 20–29)
Calcium: 10 mg/dL (ref 8.7–10.2)
Chloride: 99 mmol/L (ref 96–106)
Creatinine, Ser: 1.1 mg/dL (ref 0.76–1.27)
Globulin, Total: 3.5 g/dL (ref 1.5–4.5)
Glucose: 133 mg/dL — ABNORMAL HIGH (ref 70–99)
Potassium: 3.6 mmol/L (ref 3.5–5.2)
Sodium: 139 mmol/L (ref 134–144)
Total Protein: 7.8 g/dL (ref 6.0–8.5)
eGFR: 91 mL/min/1.73 (ref >59)

## 2024-08-22 LAB — HEMOGLOBIN A1C
Est. average glucose Bld gHb Est-mCnc: 203 mg/dL
Hgb A1c MFr Bld: 8.7 % — ABNORMAL HIGH (ref 4.8–5.6)

## 2024-08-23 ENCOUNTER — Ambulatory Visit: Payer: Self-pay | Admitting: Internal Medicine

## 2024-08-23 DIAGNOSIS — E118 Type 2 diabetes mellitus with unspecified complications: Secondary | ICD-10-CM

## 2024-08-24 MED ORDER — METFORMIN HCL ER 500 MG PO TB24: 1000.0000 mg | ORAL_TABLET | Freq: Two times a day (BID) | ORAL | 5 refills | Status: DC

## 2024-09-03 ENCOUNTER — Encounter: Payer: Self-pay | Admitting: Internal Medicine

## 2024-09-03 ENCOUNTER — Other Ambulatory Visit: Payer: Self-pay

## 2024-09-03 MED ORDER — TIRZEPATIDE 12.5 MG/0.5ML ~~LOC~~ SOAJ: 12.5000 mg | SUBCUTANEOUS | 0 refills | Status: DC

## 2024-10-01 ENCOUNTER — Other Ambulatory Visit: Payer: Self-pay | Admitting: Internal Medicine

## 2024-10-01 DIAGNOSIS — E118 Type 2 diabetes mellitus with unspecified complications: Secondary | ICD-10-CM

## 2024-10-01 MED ORDER — TIRZEPATIDE 15 MG/0.5ML ~~LOC~~ SOAJ: 15.0000 mg | SUBCUTANEOUS | 0 refills | Status: AC

## 2024-10-01 NOTE — Progress Notes (Unsigned)
 Date:  10/01/2024   Name:  Elijah Wells   DOB:  June 27, 1991   MRN:  [id number]   Chief Complaint: No chief complaint on file.  HPI  Review of Systems   Lab Results  Component Value Date   NA 139 08/21/2024   K 3.6 08/21/2024   CO2 26 08/21/2024   GLUCOSE 133 (H) 08/21/2024   BUN 14 08/21/2024   CREATININE 1.10 08/21/2024   CALCIUM  10.0 08/21/2024   EGFR 91 08/21/2024   GFRNONAA 90 07/25/2020   Lab Results  Component Value Date   CHOL 160 05/08/2024   HDL 39 (L) 05/08/2024   LDLCALC 93 05/08/2024   TRIG 159 (H) 05/08/2024   CHOLHDL 4.1 05/08/2024   Lab Results  Component Value Date   TSH 1.700 07/25/2020   Lab Results  Component Value Date   HGBA1C 8.7 (H) 08/21/2024   Lab Results  Component Value Date   WBC 6.4 10/01/2022   HGB 15.1 10/01/2022   HCT 47.2 10/01/2022   MCV 81 10/01/2022   PLT 282 10/01/2022   Lab Results  Component Value Date   ALT 60 (H) 08/21/2024   AST 47 (H) 08/21/2024   ALKPHOS 80 08/21/2024   BILITOT 0.7 08/21/2024   Lab Results  Component Value Date   VD25OH 23.6 (L) 04/14/2020     Patient Active Problem List   Diagnosis Date Noted   Abnormal liver enzymes 01/29/2022   Vitamin D  deficiency 06/23/2020   Class 3 severe obesity with serious comorbidity and body mass index (BMI) of 50.0 to 59.9 in adult (HCC) 06/23/2020   Type II diabetes mellitus with complication (HCC) 09/20/2017   Essential hypertension 09/20/2017   Gastroesophageal reflux disease 09/20/2017    Allergies[1]  Past Surgical History:  Procedure Laterality Date   none     WISDOM TOOTH EXTRACTION  01/2019    Social History[2]   Medication list has been reviewed and updated.  Active Medications[3]     05/08/2024    2:13 PM 07/12/2023    3:42 PM 03/19/2023    2:47 PM 12/17/2022    3:10 PM  GAD 7 : Generalized Anxiety Score  Nervous, Anxious, on Edge 0 1 1 0  Control/stop worrying 0 0 0 0  Worry too much - different things 0 1 1 1   Trouble  relaxing 0 0 0 0  Restless 0 0 0 0  Easily annoyed or irritable 0 0 0 0  Afraid - awful might happen 0 1 0 0  Total GAD 7 Score 0 3 2 1   Anxiety Difficulty Not difficult at all Not difficult at all Not difficult at all Not difficult at all       05/08/2024    2:13 PM 07/12/2023    3:42 PM 03/19/2023    2:47 PM  Depression screen PHQ 2/9  Decreased Interest 0 1 0  Down, Depressed, Hopeless 0 0 0  PHQ - 2 Score 0 1 0  Altered sleeping 0 1 0  Tired, decreased energy 0 0 1  Change in appetite 0 1 1  Feeling bad or failure about yourself  0 1 0  Trouble concentrating 0 0 1  Moving slowly or fidgety/restless 0 1 0  Suicidal thoughts 0 0 0  PHQ-9 Score 0  5  3   Difficult doing work/chores Not difficult at all Not difficult at all Not difficult at all     Data saved with a previous flowsheet row definition  BP Readings from Last 3 Encounters:  08/21/24 126/76  05/08/24 118/78  07/12/23 125/86    Physical Exam  Wt Readings from Last 3 Encounters:  08/21/24 (!) 374 lb (169.6 kg)  05/08/24 (!) 370 lb (167.8 kg)  07/12/23 (!) 381 lb 12.8 oz (173.2 kg)    There were no vitals taken for this visit.  Assessment and Plan:  Problem List Items Addressed This Visit   None   No follow-ups on file.    Leita HILARIO Adie, MD [facility] [place]           [1] No Known Allergies [2]  Social History Tobacco Use   Smoking status: Never   Smokeless tobacco: Never  Vaping Use   Vaping status: Never Used  Substance Use Topics   Alcohol use: No   Drug use: No  [3]  No outpatient medications have been marked as taking for the 10/01/24 encounter (Orders Only) with Adie Leita DEL, MD.

## 2024-10-01 NOTE — Telephone Encounter (Signed)
 Please review.  KP

## 2024-11-11 ENCOUNTER — Other Ambulatory Visit: Payer: Self-pay

## 2024-11-11 DIAGNOSIS — E118 Type 2 diabetes mellitus with unspecified complications: Secondary | ICD-10-CM

## 2024-11-11 MED ORDER — METFORMIN HCL ER 500 MG PO TB24: 1000.0000 mg | ORAL_TABLET | Freq: Two times a day (BID) | ORAL | 1 refills | Status: AC

## 2024-12-18 ENCOUNTER — Encounter: Admitting: Family Medicine
